# Patient Record
Sex: Male | Born: 1950
Health system: Southern US, Community
[De-identification: ages and names within clinical notes are randomized; demographics above are authoritative.]

## PROBLEM LIST (undated history)

## (undated) DIAGNOSIS — I10 Essential (primary) hypertension: Secondary | ICD-10-CM

## (undated) HISTORY — PX: CHOLECYSTECTOMY: SHX55

## (undated) HISTORY — PX: COLONOSCOPY: SHX174

---

## 2001-10-03 ENCOUNTER — Ambulatory Visit (HOSPITAL_COMMUNITY): Admission: RE | Admit: 2001-10-03 | Discharge: 2001-10-03 | Payer: Self-pay | Admitting: Internal Medicine

## 2002-07-23 ENCOUNTER — Ambulatory Visit (HOSPITAL_COMMUNITY): Admission: RE | Admit: 2002-07-23 | Discharge: 2002-07-23 | Payer: Self-pay | Admitting: *Deleted

## 2002-08-10 ENCOUNTER — Ambulatory Visit (HOSPITAL_COMMUNITY): Admission: RE | Admit: 2002-08-10 | Discharge: 2002-08-10 | Payer: Self-pay | Admitting: *Deleted

## 2002-08-11 ENCOUNTER — Ambulatory Visit (HOSPITAL_COMMUNITY): Admission: RE | Admit: 2002-08-11 | Discharge: 2002-08-11 | Payer: Self-pay | Admitting: *Deleted

## 2003-03-30 ENCOUNTER — Ambulatory Visit (HOSPITAL_COMMUNITY): Admission: RE | Admit: 2003-03-30 | Discharge: 2003-03-30 | Payer: Self-pay | Admitting: *Deleted

## 2004-01-05 ENCOUNTER — Other Ambulatory Visit: Admission: RE | Admit: 2004-01-05 | Discharge: 2004-01-05 | Payer: Self-pay | Admitting: Dermatology

## 2007-01-23 ENCOUNTER — Ambulatory Visit: Payer: Self-pay | Admitting: Internal Medicine

## 2007-02-24 ENCOUNTER — Ambulatory Visit (HOSPITAL_COMMUNITY): Admission: RE | Admit: 2007-02-24 | Discharge: 2007-02-24 | Payer: Self-pay | Admitting: Internal Medicine

## 2007-02-24 ENCOUNTER — Ambulatory Visit: Payer: Self-pay | Admitting: Internal Medicine

## 2010-08-22 NOTE — Op Note (Signed)
Marcus [name], Marcus [name]              ACCOUNT NO.:  [id number]   MEDICAL RECORD NO.:  [id number]          PATIENT TYPE:  AMB   LOCATION:  DAY                           FACILITY:  [facility]   PHYSICIAN:  R. Roetta Sessions, M.D. DATE OF BIRTH:  08-08-1950   DATE OF PROCEDURE:  02/24/2007  DATE OF DISCHARGE:                               OPERATIVE REPORT   DIAGNOSTIC PROCEDURE:  Colonoscopy.   ENDOSCOPIST:  Dr. Jonathon Bellows.   INDICATIONS FOR PROCEDURE:  A 60 year old gentleman with a history of  intermittent pruritus ani, a history of internal hemorrhoids.  He has a  family history of colorectal cancer.  This approach was discussed with  the patient at length.  The potential risks, benefits and alternatives  have been reviewed and questions have been answered and he is agreeable.  It is notable that Analpram-HC cream was prescribed in the office on  January 23, 2007.  This has helped significantly with his pruritus  symptoms.  He has not passed any blood per rectum recently.   DESCRIPTION OF PROCEDURE:  O2 saturation, blood pressure, pulse and  respirations were monitored throughout the entire procedure.  Conscious  sedation with Versed 5 mg IV and Dermol 75 mg IV in divided doses.  The  instrument was Pentax video-chip system.   FINDINGS:  A digital rectal exam revealed no abnormalities.  The  endoscopic prep was sub-optimal with granular residual stool lining of  the entire colon, which made it more difficult to see the mucosa.   COLON:  The colonic mucosa was surveyed from the rectosigmoid junction  through the left and transverse and right colon to the appendicular  orifice, the ileocecal valve and cecum.  These structures were well-seen  and photographed for the record.  The terminal ileum was intubated to 5  cm.  From this point the scope was slowly and cautiously withdrawn.  All  previously-mentioned mucosal surfaces were again seen.  The colonic  mucosa appeared normal, as did  the terminal ileum mucosa.  The scope was  put in the rectum where __________  the rectal mucosa, including  retroflexion in the anal verge and a view of the anal canal did  demonstrate minimal friable anal canal tissue.   The patient tolerated the procedure well.  He was reactive in endoscopy.   IMPRESSION:  1. Friable anal canal, otherwise normal rectum and terminal ileum.  2. An occult anal fissure is not excluded.   RECOMMENDATIONS:  [name] is constipated.  He has only two to three  bowel movements weekly and does strain when he has a bowel movement.  In  this setting, I feel that a substantial daily fiber supplement taken  religiously every day will markedly and dramatically improve his  anorectal symptoms.  Consequently I have  recommended that he start a psyllium supplement in the way of Metamucil  power or caplets or wafers daily without fail, with good 8 to 16 ounces  of liquids.  If this does not markedly improve his anorectal symptoms,  he is to let us know.  I recommend that he  have a repeat colonoscopy in  five years.      Jonathon Bellows, M.D.  Electronically Signed     RMR/MEDQ  D:  02/24/2007  T:  02/24/2007  Job:  [id number]   cc:   Patrica Duel, M.D.  Fax: [phone]

## 2010-08-22 NOTE — Consult Note (Signed)
Marcus [name], Marcus [name]              ACCOUNT NO.:  [id number]   MEDICAL RECORD NO.:  [id number]          PATIENT TYPE:  AMB   LOCATION:  DAY                           FACILITY:  [facility]   PHYSICIAN:  R. Roetta Sessions, M.D. DATE OF BIRTH:  01-Nov-1950   DATE OF CONSULTATION:  01/23/2007  DATE OF DISCHARGE:                                 CONSULTATION   REASON FOR CONSULTATION:  Pruritus ani.   PHYSICIAN REQUESTING CONSULTATION:  Patrica Duel, M.D.   HISTORY OF PRESENT ILLNESS:  The patient is a 60 year old gentleman who  presents today for further evaluation of anorectal burning and itching.  He states he has had these symptoms off and on for several months.  After he has a bowel movement, he has severe burning and itching which  drives me crazy.  He denies any hard stools.  Generally, his stools  are very soft and occur every other day.  He rarely has to do any  straining.  Denies any blood in the stool.  He is currently using  Vaseline and Preparation H wipes.  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  He has an intentional weight loss  of 8 pounds.  He denies any abdominal pain, nausea, vomiting, or  heartburn.  He takes OTC acid reducer as needed for heartburn with good  results.   CURRENT MEDICATIONS:  1. OTC acid reducer daily as needed.  2. Vaseline p.r.n.  3. Preparation H wipes p.r.n.   ALLERGIES:  NO KNOWN DRUG ALLERGIES.   PAST MEDICAL HISTORY:  He had a cardiac catheterization which was  negative in 2003 according to the patient. He has also had chronic GERD  and a cholecystectomy.  Patient had an EGD June of 2003 which revealed a  patulous EG junction, a solitary prepyloric antral erosion, otherwise  negative.  H. pylori serologies were positive and he underwent Prevpac  therapy.  Colonoscopy in 2000 revealed internal hemorrhoids.   FAMILY HISTORY:  Father deceased, age 56, of CHF.  He had a paternal  grandfather who had colorectal  cancer.   SOCIAL HISTORY:  He is married with 2 children.  He is employed with  [employer].  Never been a smoker.  No alcohol use.   REVIEW OF SYSTEMS:  See HPI for GI.  CONSTITUTIONAL:  See HPI.  CARDIOPULMONARY:  No chest pain, shortness of breath, palpitations, or  cough.   PHYSICAL EXAM:  Weight 222.  Height 6 feet 3 inches.  Temp 98.1.  Blood  pressure 118/78.  Pulse 64.  GENERAL:  Pleasant, well-nourished, well-developed, Caucasian male in no  acute distress.  SKIN:  Warm and dry.  No jaundice.  HEENT:  Sclerae nonicteric.  Oropharyngeal mucosa moist and pink.  No  lesions, erythema, or exudate.  No lymphadenopathy or thyromegaly.  CHEST:  Lungs are clear to auscultation.  CARDIAC:  Reveals regular rate and rhythm.  Normal S1 and S2.  No  murmurs, rubs, or gallops.  ABDOMEN:  Positive bowel sounds.  Abdomen soft, nontender, and  nondistended.  No organomegaly or masses.  No rebound tenderness or  guarding.  No abdominal  bruits or hernias.  EXTREMITIES:  No edema.  RECTAL:  Deferred to time of colonoscopy.   IMPRESSION:  The patient is a 60 year old gentleman with several-month  history of pruritus ani.  History of internal hemorrhoids.  Suspect his  symptoms secondary to benign anorectal disease.  He is due for followup  colonoscopy as recommended previously by Dr. Jena Gauss.  He is supposed to  have one sometime this year.  Because of ongoing symptoms and family  history of colon and rectal cancer, we will go ahead and proceed with  colonoscopy at this time.  Please note patient requested that I defer  rectal exam until time of colonoscopy.   PLAN:  1. Colonoscopy with Dr. Jena Gauss in the near future.  2. Analpram-HCapply anorectally t.i.d., samples provided.  Further      recommendations will follow.      Tana Coast, P.AJonathon Bellows, M.D.  Electronically Signed    LL/MEDQ  D:  01/23/2007  T:  01/24/2007  Job:  [id number]   cc:   Patrica Duel, M.D.   Fax: [phone]

## 2010-08-25 NOTE — Procedures (Signed)
   NAME:  Marcus [name], Marcus [name]                        ACCOUNT NO.:  [id number]   MEDICAL RECORD NO.:  [id number]                   PATIENT TYPE:  OUT   LOCATION:  RAD                                  FACILITY:  [facility]   PHYSICIAN:  Vida Roller, M.D.                DATE OF BIRTH:  12/16/1950   DATE OF PROCEDURE:  DATE OF DISCHARGE:                                  ECHOCARDIOGRAM   REFERRING PHYSICIAN:  Dr. Patrica Duel.   Pit #[id number]  Tip Count: [id number]   This is a 60 year old gentleman with palpitations.  This is an assessment  for LV function.   The technical quality of the study was suboptimal but likely diagnostic.  M-  mode measurements:  The aorta is 33 mm.   The left atrium is 46 mm which is enlarged.   The septum is 12 mm which is enlarged.   The posterior wall is 11 mm.   The left ventricular diastolic dimension is 41 mm.   The left ventricular systolic dimension is 34 mm.   2-D and Doppler imaging:  The left ventricle is normal size.  The overall  left ventricular function is moderately depressed with an estimated ejection  fraction of 30 to 35%.  There is evidence of apical hypokinesis that extends  from the distal anterior wall around to the distal inferior wall, and there  is also evidence of mild distal lateral wall hypokinesis as well.  The  diastolic function was not assessed.   The right ventricle appears to be normal size with normal systolic function.   Both atria are mildly enlarged.  There is no evidence of atrial septal  defect.   The aortic valve appears to be tri-leaflet.  It is mildly calcified.  Leaflet movement appears normal.  There is no stenosis or regurgitation  seen.   The mitral valve has mild annular calcification with trace to mild  insufficiency, no stenosis is seen.   The tricuspid valve appears morphologically unremarkable with mild  regurgitation, no stenosis is seen.   The pulmonic valve was not well seen.   The  aorta is normal size but is not well imaged in multiple views.   The tri-cardial structures appear normal.   The inferior vena cava appears to be normal size.                                                 Vida Roller, M.D.    JH/MEDQ  D:  07/24/2002  T:  07/24/2002  Job:  [id number]

## 2010-08-25 NOTE — Procedures (Signed)
NAME:  Marcus [name], Marcus [name]                        ACCOUNT NO.:  [id number]   MEDICAL RECORD NO.:  [id number]                   PATIENT TYPE:  OUT   LOCATION:  RAD                                  FACILITY:  [facility]   PHYSICIAN:  Willa Rough, M.D.                  DATE OF BIRTH:  13-Nov-1950   DATE OF PROCEDURE:  03/30/2003  DATE OF DISCHARGE:                                  ECHOCARDIOGRAM   INDICATIONS FOR PROCEDURE:  The patient has had chest pain and this study is  done for further evaluation. There is a report of an echocardiogram done in  April of 2004.   2-D ECHOCARDIOGRAM:  Aorta 32 mm.   Aortic valve is normal.   Left atrium 46 mm.   Mitral valve:  There is flat closure of mitral valve but no definite mitral  valve prolapse. There may be slight redundancy of the anterior leaflet.   Left ventricle:  End diastolic dimension 39 mm and systolic dimension 33 mm.  Wall thickness is 11 mm. Wall motion analysis reveals that at this time, the  ejection fraction is in the 60% range. From the report of April of 2004, the  ejection fraction at that time was in the 35% range and therefore, the  overall function appears to be significantly improved. On the prior study,  there was mention of decreased motion of the apex. The apex appears to move  well at this time. There is question of some hypokinesis and possibly some  dyskinesis of the inferior wall at the base. The short axis views may be off  axis and this may affect the reading in this area.   Right ventricle normal.   Tricuspid valve normal.   Pulmonic valve normal.   Pericardial effusion:  There is no significant pericardial effusion.   Doppler analysis reveals mild tricuspid regurgitation. Right heart pressures  are not estimated from this study.   IMPRESSION:  This study shows that there are no major valvular problems. The  overall left ventricular function is good and seems improved since the  report of the echocardiogram  of April of 2004. Currently there is question  of some decreased motion of the inferior wall at the base.      ___________________________________________                                            Willa Rough, M.D.   JK/MEDQ  D:  03/30/2003  T:  03/30/2003  Job:  [id number]   cc:   Patrica Duel, M.D.  [address], [address]  [place]  [place] [address]  Fax: [phone]   Vida Roller, M.D.  Fax: [phone]

## 2010-08-25 NOTE — Cardiovascular Report (Signed)
NAME:  Marcus [name], Marcus [name]                        ACCOUNT NO.:  [id number]   MEDICAL RECORD NO.:  [id number]                   PATIENT TYPE:  OIB   LOCATION:  2899                                 FACILITY:  [facility]   PHYSICIAN:  Vida Roller, M.D.                DATE OF BIRTH:  01-30-1951   DATE OF PROCEDURE:  08/11/2002  DATE OF DISCHARGE:                              CARDIAC CATHETERIZATION   PROCEDURES:  Left heart catheterization, selective coronary angiography and  left ventriculography all via the right radial approach.   HISTORY OF PRESENT ILLNESS:  The patient is a 60 year old gentleman with  risk factors of hypertension, hyperlipidemia in family history who presented  to see me with atypical chest discomfort.  He underwent a treadmill exam  where he was able to exercise nine minutes of a Bruce protocol, achieved 10  minutes of exercise and had no ischemic ST and T wave changes, but had  nonsustained ventricular tachycardia which was asymptomatic on EKG.  An  echocardiogram performed after the stress test showed depressed LV function  with ejection fraction estimated at 30% and he was referred for elective  heart catheterization.  Since that time, he has been started on a beta-  blocker and has had resolution of his symptoms feeling well without any  difficulties.  He comes to the catheterization laboratory for an elective  case.   DESCRIPTION OF PROCEDURE:  After obtaining informed consent and a detailed  evaluation including a balance test of the right wrist, the patient was  brought to the cardiac catheterization laboratory in fasting state.  There  he was prepped and draped in the usual sterile manner and local anesthetic  was obtained over the right wrist using 1% lidocaine without epinephrine.  The right radial artery was cannulated using the modified Seldinger  technique with a 6-French, 10 cm sheath and left heart catheterization was  performed using a 6-French,  Judkins left 4, a 6-French, Judkins right 4 and  a 6-French angled pigtail catheter.  The pigtail catheter was advanced under  fluoroscopic guidance into the ascending aorta and then prolapsed across the  aortic valve.  There it was connected to a pressure tracing system and then  to power injection where a power injection was performed at a rate of 13 cc  per second for a total of 39 cc at a 1/2 second rate of right 600 PSI.  At  conclusion of the left ventriculogram, the catheter was reconnected to the  pressure tracing system and prolapsed across the aortic valve and continuous  pressure monitoring.  The catheter was then removed.  The patient had a  RadStat device placed and hemostasis was obtained with that.  The patient  was moved back to the cardiology holding area with no evidence of ecchymosis  or hematoma formation.  Total fluoroscopic time was 9.3 minutes.  Total  contrast was 140 cc.  RESULTS:  Left ventricular pressure was measured at 119/70 with an end-  diastolic pressure of 13 mmHg.   Aortic pressure was measured at 118/72 with a mean arterial pressure of 93  mmHg.   Selected coronary angiography:   The left main coronary artery is a large artery which is angiographically  normal.   The left circumflex coronary artery is a large dominant vessel with two  moderate sized obtuse marginals, a moderate size posterolateral branch and a  normal size posterior descending coronary artery.   The left anterior descending coronary artery is a normal sized artery which  has two small diagonal branches and is otherwise a normal appearing artery.   The right coronary artery is a small, nondominant vessel.   The entire coronary distribution is angiographically free of discharge.   Left ventriculogram:   The left ventriculogram reveals an ejection fraction of approximately 50-55%  with no mitral regurgitation and no significant wall motion abnormalities.   ASSESSMENT:  This  is a gentleman who has a mild, nonischemic cardiomyopathy  which probably was secondary to a viral illness which has resolved over the  course of his therapy.  We will continue to keep him on low dose of beta-  blocker both for his hypertension and fr the nonsustained ventricular  tachycardia and will follow his risks factors including his  hypercholesterolemia.                                               Vida Roller, M.D.    JH/MEDQ  D:  08/11/2002  T:  08/12/2002  Job:  [id number]

## 2010-08-25 NOTE — Op Note (Signed)
[facility]  Patient:    Marcus [name], Marcus [name] Visit Number: [id number] MRN: [id number]          Service Type: END Location: DAY Attending Physician:  Jonathon Bellows Dictated by:   Roetta Sessions, M.D. Proc. Date: 10/03/01 Admit Date:  10/03/2001 Discharge Date: 10/03/2001   CC:         Patrica Duel, M.D.   Operative Report  PROCEDURE:  Esophagogastroduodenoscopy.  ENDOSCOPIST:  Roetta Sessions, M.D.  INDICATIONS:  This is a 60 year old gentleman with a 10 year history of gastroesophageal reflux disease without arm symptoms.  He takes Prevacid 30 mg orally daily on a p.r.n. basis.  Esophagogastroduodenoscopy is now being done to further evaluate his long-standing GERD.  This approach has been discussed with the patient at length at the bedside.  Potential risks, benefits and alternatives have been reviewed and any questions answered.  He is agreeable. Please see my dictated H&P for information.  He is agreeable for conscious sedation.  DESCRIPTION OF PROCEDURE:  The patient was placed in the left lateral decubitus position.  O2 saturation, blood pressure, pulse and respirations were monitored throughout the entire procedure with conscious sedation with Versed 3 mg IV and Demerol 75 mg IV.  Cetacaine spray for topical pharyngeal anesthesia.  Instrument was an Olympus video chip gastroscope.  Findings:  Examination of the tubular esophagus revealed normal mucosa.  The EG junction was patulous and easily traversed.  Stomach:  The gastric cavity insufflated well with air.  Thorough examination of the gastric mucosa including retroflexed view of the proximal stomach, esophagogastric junction, demonstrated a solitary antral erosion approximately 3 mm in diameter.  Otherwise, the mucosa appeared normal, and the pylorus was patent and easily traversed.  Duodenum:  The bulb and second portion appeared normal.  Thereapeutic/diagnostic maneuvers:   None.  The patient tolerated the procedure well and was reactive in endoscopy.  IMPRESSION: 1. Patulous esophagogastric junction but esophageal mucosa appeared normal. 2. Solitary prepyloric antral erosion.  The remainder of the gastric mucosa    appeared normal. 3. Normal first and second duodenum.  RECOMMENDATIONS: 1. Take Prevacid 30 mg orally every day before breakfast. 2. Antireflux measures, literature provided to the patient. 3. Helicobacter pylori serologies. 4. Further recommendations to follow. Dictated by:   Roetta Sessions, M.D. Attending Physician:  Jonathon Bellows DD:  10/03/01 TD:  10/05/01 Job: [id number]

## 2012-01-17 ENCOUNTER — Encounter: Payer: Self-pay | Admitting: Internal Medicine

## 2015-05-19 ENCOUNTER — Ambulatory Visit (INDEPENDENT_AMBULATORY_CARE_PROVIDER_SITE_OTHER): Payer: 59 | Admitting: Otolaryngology

## 2015-05-19 DIAGNOSIS — H7201 Central perforation of tympanic membrane, right ear: Secondary | ICD-10-CM | POA: Diagnosis not present

## 2015-05-19 DIAGNOSIS — H6123 Impacted cerumen, bilateral: Secondary | ICD-10-CM

## 2015-05-19 DIAGNOSIS — H9071 Mixed conductive and sensorineural hearing loss, unilateral, right ear, with unrestricted hearing on the contralateral side: Secondary | ICD-10-CM | POA: Diagnosis not present

## 2015-05-19 DIAGNOSIS — H903 Sensorineural hearing loss, bilateral: Secondary | ICD-10-CM

## 2015-11-17 ENCOUNTER — Ambulatory Visit (INDEPENDENT_AMBULATORY_CARE_PROVIDER_SITE_OTHER): Payer: 59 | Admitting: Otolaryngology

## 2015-11-17 DIAGNOSIS — H6523 Chronic serous otitis media, bilateral: Secondary | ICD-10-CM | POA: Diagnosis not present

## 2015-11-17 DIAGNOSIS — H6983 Other specified disorders of Eustachian tube, bilateral: Secondary | ICD-10-CM

## 2015-11-17 DIAGNOSIS — H6123 Impacted cerumen, bilateral: Secondary | ICD-10-CM

## 2015-11-17 DIAGNOSIS — H900 Conductive hearing loss, bilateral: Secondary | ICD-10-CM

## 2016-04-05 ENCOUNTER — Other Ambulatory Visit (HOSPITAL_COMMUNITY): Payer: Self-pay | Admitting: Family Medicine

## 2016-04-05 DIAGNOSIS — E041 Nontoxic single thyroid nodule: Secondary | ICD-10-CM

## 2016-05-03 ENCOUNTER — Ambulatory Visit (HOSPITAL_COMMUNITY)
Admission: RE | Admit: 2016-05-03 | Discharge: 2016-05-03 | Disposition: A | Discharge status: Home / Self Care | Payer: Medicare Other | Source: Ambulatory Visit | Attending: Family Medicine | Admitting: Family Medicine

## 2016-05-03 DIAGNOSIS — E041 Nontoxic single thyroid nodule: Secondary | ICD-10-CM

## 2016-05-14 ENCOUNTER — Ambulatory Visit (INDEPENDENT_AMBULATORY_CARE_PROVIDER_SITE_OTHER): Payer: Medicare Other | Admitting: Otolaryngology

## 2016-05-14 DIAGNOSIS — H7203 Central perforation of tympanic membrane, bilateral: Secondary | ICD-10-CM | POA: Diagnosis not present

## 2016-05-14 DIAGNOSIS — D440 Neoplasm of uncertain behavior of thyroid gland: Secondary | ICD-10-CM

## 2016-05-15 ENCOUNTER — Other Ambulatory Visit (INDEPENDENT_AMBULATORY_CARE_PROVIDER_SITE_OTHER): Payer: Self-pay | Admitting: Otolaryngology

## 2016-05-15 DIAGNOSIS — E041 Nontoxic single thyroid nodule: Secondary | ICD-10-CM

## 2016-05-24 ENCOUNTER — Encounter (HOSPITAL_COMMUNITY): Payer: Self-pay

## 2016-05-24 ENCOUNTER — Ambulatory Visit (HOSPITAL_COMMUNITY)
Admission: RE | Admit: 2016-05-24 | Discharge: 2016-05-24 | Disposition: A | Discharge status: Home / Self Care | Payer: Medicare Other | Source: Ambulatory Visit | Attending: Otolaryngology | Admitting: Otolaryngology

## 2016-05-24 DIAGNOSIS — E041 Nontoxic single thyroid nodule: Secondary | ICD-10-CM | POA: Insufficient documentation

## 2016-05-24 HISTORY — DX: Essential (primary) hypertension: I10

## 2016-05-24 MED ORDER — LIDOCAINE HCL (PF) 2 % IJ SOLN
INTRAMUSCULAR | Status: AC
  Filled 2016-05-24: qty 10

## 2016-05-24 NOTE — Discharge Instructions (Signed)
Thyroid Biopsy °The thyroid gland is a butterfly-shaped gland located in the front of the neck. It produces hormones that affect metabolism, growth and development, and body temperature. Thyroid biopsy is a procedure in which small samples of tissue or fluid are removed from the thyroid gland. The samples are then looked at under a microscope to check for abnormalities. This procedure is done to determine the cause of thyroid problems. It may be done to check for infection, cancer, or other thyroid problems. °Two methods may be used for a thyroid biopsy. In one method, a thin needle is inserted through the skin and into the thyroid gland. In the other method, an open incision is made through the skin. °Tell a health care provider about: °· Any allergies you have. °· All medicines you are taking, including vitamins, herbs, eye drops, creams, and over-the-counter medicines. °· Any problems you or family members have had with anesthetic medicines. °· Any blood disorders you have. °· Any surgeries you have had. °· Any medical conditions you have. °What are the risks? °Generally, this is a safe procedure. However, problems can occur and include: °· Bleeding from the procedure site. °· Infection. °· Injury to structures near the thyroid gland. °What happens before the procedure? °· Ask your health care provider about: °¨ Changing or stopping your regular medicines. This is especially important if you are taking diabetes medicines or blood thinners. °¨ Taking medicines such as aspirin and ibuprofen. These medicines can thin your blood. Do not take these medicines before your procedure if your health care provider asks you not to. °· Do not eat or drink anything after midnight on the night before the procedure or as directed by your health care provider. °· You may have a blood sample taken. °What happens during the procedure? °Either of these methods may be used to perform a thyroid biopsy: °· Fine needle biopsy. You may  be given medicine to help you relax (sedative). You will be asked to lie on your back with your head tipped backward to extend your neck. An area on your neck will be cleaned. A needle will then be inserted through the skin of your neck. You may be asked to avoid coughing, talking, swallowing, or making sounds during some portions of the procedure. The needle will be withdrawn once the tissue or fluid samples have been removed. Pressure may be applied to your neck to reduce swelling and ensure that bleeding has stopped. The samples will be sent to a lab for examination. °· Open biopsy. You will be given medicine to make you sleep (general anesthetic). An incision will be made in your neck. A sample of thyroid tissue will be removed using surgical tools. The tissue sample will be sent for examination. In some cases, the sample may be examined during the biopsy. If that is done and cancer cells are found, some or all of the thyroid gland may be removed. The incision will be closed with stitches. °What happens after the procedure? °· Your recovery will be assessed and monitored. °· You may have soreness and tenderness at the site of the biopsy. This should go away after a few days. °· If you had an open biopsy, you may have a hoarse voice or sore throat for a couple days. °· It is your responsibility to get your test results. °This information is not intended to replace advice given to you by your health care provider. Make sure you discuss any questions you have with your health   care provider. °Document Released: 01/21/2007 Document Revised: 11/27/2015 Document Reviewed: 06/18/2013 °Elsevier Interactive Patient Education © 2017 [employer] ° °

## 2016-05-24 NOTE — Procedures (Signed)
PreOperative Dx: RT thyroid nodule Postoperative Dx: RT thyroid nodule Procedure:   US guided FNA of RT thyroid nodule Radiologist:  Thornton Papas Anesthesia:  3 ml of 2% lidocaine Specimen:  FNA x 3  EBL:   < 1 ml Complications: None

## 2016-05-28 DIAGNOSIS — H1132 Conjunctival hemorrhage, left eye: Secondary | ICD-10-CM | POA: Diagnosis not present

## 2016-05-28 DIAGNOSIS — S0502XA Injury of conjunctiva and corneal abrasion without foreign body, left eye, initial encounter: Secondary | ICD-10-CM | POA: Diagnosis not present

## 2016-08-21 DIAGNOSIS — H1132 Conjunctival hemorrhage, left eye: Secondary | ICD-10-CM | POA: Diagnosis not present

## 2016-08-29 DIAGNOSIS — H2513 Age-related nuclear cataract, bilateral: Secondary | ICD-10-CM | POA: Diagnosis not present

## 2016-11-08 DIAGNOSIS — L218 Other seborrheic dermatitis: Secondary | ICD-10-CM | POA: Diagnosis not present

## 2016-11-08 DIAGNOSIS — D224 Melanocytic nevi of scalp and neck: Secondary | ICD-10-CM | POA: Diagnosis not present

## 2016-11-08 DIAGNOSIS — B078 Other viral warts: Secondary | ICD-10-CM | POA: Diagnosis not present

## 2016-11-26 ENCOUNTER — Ambulatory Visit (INDEPENDENT_AMBULATORY_CARE_PROVIDER_SITE_OTHER): Payer: Medicare Other | Admitting: Otolaryngology

## 2016-11-26 DIAGNOSIS — H7203 Central perforation of tympanic membrane, bilateral: Secondary | ICD-10-CM | POA: Diagnosis not present

## 2016-11-26 DIAGNOSIS — H6121 Impacted cerumen, right ear: Secondary | ICD-10-CM | POA: Diagnosis not present

## 2016-11-26 DIAGNOSIS — D440 Neoplasm of uncertain behavior of thyroid gland: Secondary | ICD-10-CM

## 2017-01-15 DIAGNOSIS — I10 Essential (primary) hypertension: Secondary | ICD-10-CM | POA: Diagnosis not present

## 2017-01-15 DIAGNOSIS — Z1329 Encounter for screening for other suspected endocrine disorder: Secondary | ICD-10-CM | POA: Diagnosis not present

## 2017-01-15 DIAGNOSIS — Z0000 Encounter for general adult medical examination without abnormal findings: Secondary | ICD-10-CM | POA: Diagnosis not present

## 2017-01-15 DIAGNOSIS — Z125 Encounter for screening for malignant neoplasm of prostate: Secondary | ICD-10-CM | POA: Diagnosis not present

## 2017-01-15 DIAGNOSIS — Z1389 Encounter for screening for other disorder: Secondary | ICD-10-CM | POA: Diagnosis not present

## 2017-01-15 DIAGNOSIS — Z0001 Encounter for general adult medical examination with abnormal findings: Secondary | ICD-10-CM | POA: Diagnosis not present

## 2017-01-15 DIAGNOSIS — Z23 Encounter for immunization: Secondary | ICD-10-CM | POA: Diagnosis not present

## 2017-01-15 DIAGNOSIS — N401 Enlarged prostate with lower urinary tract symptoms: Secondary | ICD-10-CM | POA: Diagnosis not present

## 2017-01-29 DIAGNOSIS — R319 Hematuria, unspecified: Secondary | ICD-10-CM | POA: Diagnosis not present

## 2017-03-08 DIAGNOSIS — N183 Chronic kidney disease, stage 3 (moderate): Secondary | ICD-10-CM | POA: Diagnosis not present

## 2017-03-08 DIAGNOSIS — N400 Enlarged prostate without lower urinary tract symptoms: Secondary | ICD-10-CM | POA: Diagnosis not present

## 2017-03-08 DIAGNOSIS — K219 Gastro-esophageal reflux disease without esophagitis: Secondary | ICD-10-CM | POA: Diagnosis not present

## 2017-03-25 ENCOUNTER — Other Ambulatory Visit (HOSPITAL_COMMUNITY): Payer: Self-pay | Admitting: Nephrology

## 2017-03-25 DIAGNOSIS — N183 Chronic kidney disease, stage 3 unspecified: Secondary | ICD-10-CM

## 2017-04-22 ENCOUNTER — Ambulatory Visit (HOSPITAL_COMMUNITY): Payer: Medicare Other

## 2017-05-27 ENCOUNTER — Ambulatory Visit (INDEPENDENT_AMBULATORY_CARE_PROVIDER_SITE_OTHER): Payer: Medicare Other | Admitting: Otolaryngology

## 2017-05-27 DIAGNOSIS — H7203 Central perforation of tympanic membrane, bilateral: Secondary | ICD-10-CM

## 2017-05-27 DIAGNOSIS — H6983 Other specified disorders of Eustachian tube, bilateral: Secondary | ICD-10-CM

## 2017-07-02 ENCOUNTER — Encounter: Payer: Self-pay | Admitting: Internal Medicine

## 2017-07-25 DIAGNOSIS — H01025 Squamous blepharitis left lower eyelid: Secondary | ICD-10-CM | POA: Diagnosis not present

## 2017-07-29 ENCOUNTER — Ambulatory Visit (INDEPENDENT_AMBULATORY_CARE_PROVIDER_SITE_OTHER): Payer: Self-pay

## 2017-07-29 DIAGNOSIS — Z1211 Encounter for screening for malignant neoplasm of colon: Secondary | ICD-10-CM

## 2017-07-29 MED ORDER — PEG 3350-KCL-NA BICARB-NACL 420 G PO SOLR: 4000.0000 mL | ORAL | 0 refills | Status: DC

## 2017-07-29 NOTE — Patient Instructions (Addendum)
Marcus [name]   Jan 13, 1951 MRN: [id number]    Procedure Date: 08/21/17 Time to register: 12:00pm Place to register: [facility] Procedure Time: 1:00pm Scheduled provider: R. Garfield Cornea, MD  PREPARATION FOR COLONOSCOPY WITH TRI-LYTE SPLIT PREP  Please notify us immediately if you are diabetic, take iron supplements, or if you are on Coumadin or any other blood thinners.     You will need to purchase 1 fleet enema and 1 box of Bisacodyl '5mg'$  tablets.   2 DAYS BEFORE PROCEDURE:  DATE: 08/19/17   DAY: Monday Begin clear liquid diet AFTER your lunch meal. NO SOLID FOODS after this point.  1 DAY BEFORE PROCEDURE:  DATE: 08/20/17   DAY: Tuesday Continue clear liquids the entire day - NO SOLID FOOD.     At 2:00 pm:  Take 2 Bisacodyl tablets.   At 4:00pm:  Start drinking your solution. Make sure you mix well per instructions on the bottle. Try to drink 1 (one) 8 ounce glass every 10-15 minutes until you have consumed HALF the jug. You should complete by 6:00pm.You must keep the left over solution refrigerated until completed next day.  Continue clear liquids. You must drink plenty of clear liquids to prevent dehyration and kidney failure. Nothing to eat or drink after midnight.  EXCEPTION: If you take medications for your heart, blood pressure or breathing, you may take these medications with a small amount of clear liquid.    DAY OF PROCEDURE:   DATE: 08/21/17   DAY: Wednesday    Five hours before your procedure time @ 8:00am:  Finish remaining amout of bowel prep, drinking 1 (one) 8 ounce glass every 10-15 minutes until complete. You have two hours to consume remaining prep.   Three hours before your procedure time '@10'$ :00am:  Nothing by mouth.   At least one hour before going to the hospital:  Give yourself one Fleet enema. You may take your morning medications with sip of water unless we have instructed otherwise.      Please see below for Dietary  Information.  CLEAR LIQUIDS INCLUDE:  Water Jello (NOT red in color)   Ice Popsicles (NOT red in color)   Tea (sugar ok, no milk/cream) Powdered fruit flavored drinks  Coffee (sugar ok, no milk/cream) Gatorade/ Lemonade/ Kool-Aid  (NOT red in color)   Juice: apple, white grape, white cranberry Soft drinks  Clear bullion, consomme, broth (fat free beef/chicken/vegetable)  Carbonated beverages (any kind)  Strained chicken noodle soup Hard Candy   Remember: Clear liquids are liquids that will allow you to see your fingers on the other side of a clear glass. Be sure liquids are NOT red in color, and not cloudy, but CLEAR.  DO NOT EAT OR DRINK ANY OF THE FOLLOWING:  Dairy products of any kind   Cranberry juice Tomato juice / V8 juice   Grapefruit juice Orange juice     Red grape juice  Do not eat any solid foods, including such foods as: cereal, oatmeal, yogurt, fruits, vegetables, creamed soups, eggs, bread, crackers, pureed foods in a blender, etc.   HELPFUL HINTS FOR DRINKING PREP SOLUTION:   Make sure prep is extremely cold. Mix and refrigerate the the morning of the prep. You may also put in the freezer.   You may try mixing some Crystal Light or Country Time Lemonade if you prefer. Mix in small amounts; add more if necessary.  Try drinking through a straw  Rinse mouth with water or a mouthwash  between glasses, to remove after-taste.  Try sipping on a cold beverage /ice/ popsicles between glasses of prep.  Place a piece of sugar-free hard candy in mouth between glasses.  If you become nauseated, try consuming smaller amounts, or stretch out the time between glasses. Stop for 30-60 minutes, then slowly start back drinking.        OTHER INSTRUCTIONS  You will need a responsible adult at least 68 years of age to accompany you and drive you home. This person must remain in the waiting room during your procedure. The hospital will cancel your procedure if you do not have a  responsible adult with you.   1. Wear loose fitting clothing that is easily removed. 2. Leave jewelry and other valuables at home.  3. Remove all body piercing jewelry and leave at home. 4. Total time from sign-in until discharge is approximately 2-3 hours. 5. You should go home directly after your procedure and rest. You can resume normal activities the day after your procedure. 6. The day of your procedure you should not:  Drive  Make legal decisions  Operate machinery  Drink alcohol  Return to work   You may call the office (Dept: [phone]) before 5:00pm, or page the doctor on call [phone]) after 5:00pm, for further instructions, if necessary.   Insurance Information YOU WILL NEED TO CHECK WITH YOUR INSURANCE COMPANY FOR THE BENEFITS OF COVERAGE YOU HAVE FOR THIS PROCEDURE.  UNFORTUNATELY, NOT ALL INSURANCE COMPANIES HAVE BENEFITS TO COVER ALL OR PART OF THESE TYPES OF PROCEDURES.  IT IS YOUR RESPONSIBILITY TO CHECK YOUR BENEFITS, HOWEVER, WE WILL BE GLAD TO ASSIST YOU WITH ANY CODES YOUR INSURANCE COMPANY MAY NEED.    PLEASE NOTE THAT MOST INSURANCE COMPANIES WILL NOT COVER A SCREENING COLONOSCOPY FOR PEOPLE UNDER THE AGE OF 50  IF YOU HAVE BCBS INSURANCE, YOU MAY HAVE BENEFITS FOR A SCREENING COLONOSCOPY BUT IF POLYPS ARE FOUND THE DIAGNOSIS WILL CHANGE AND THEN YOU MAY HAVE A DEDUCTIBLE THAT WILL NEED TO BE MET. SO PLEASE MAKE SURE YOU CHECK YOUR BENEFITS FOR A SCREENING COLONOSCOPY AS WELL AS A DIAGNOSTIC COLONOSCOPY.

## 2017-07-29 NOTE — Progress Notes (Signed)
Appropriate.

## 2017-07-29 NOTE — Progress Notes (Addendum)
Gastroenterology Pre-Procedure Review  Request Date:07/29/17 Requesting Physician: Dr.Fusco (last tcs RMR- 02/14/2007 no polyp-paternal grandfather had CRC)  PATIENT REVIEW QUESTIONS: The patient responded to the following health history questions as indicated:    1. Diabetes Melitis: no 2. Joint replacements in the past 12 months: no 3. Major health problems in the past 3 months: no 4. Has an artificial valve or MVP: no 5. Has a defibrillator: no 6. Has been advised in past to take antibiotics in advance of a procedure teeth cleaning: no 7. Family history of colon cancer: paternal grandfather 14. Alcohol Use: no 9. History of sleep apnea: no  10. History of coronary artery or other vascular stents placed within the last 12 months: no 11. History of any prior anesthesia complications: no    MEDICATIONS & ALLERGIES:    Patient reports the following regarding taking any blood thinners:   Plavix? no Aspirin? no Coumadin? no Brilinta? no Xarelto? no Eliquis? no Pradaxa? no Savaysa? no Effient? no  Patient confirms/reports the following medications:  Current Outpatient Medications  Medication Sig Dispense Refill  . amLODipine (NORVASC) 10 MG tablet Take 10 mg by mouth daily.    Marland Kitchen doxycycline (VIBRA-TABS) 100 MG tablet TAKE 2 TABLETS FIRST DAY THEN TAKE 1 TABLET DAILY FOR 10 DAYS  1  . polyethylene glycol (MIRALAX / GLYCOLAX) packet Take 17 g by mouth daily.     No current facility-administered medications for this visit.     Patient confirms/reports the following allergies:  No Known Allergies  No orders of the defined types were placed in this encounter.   AUTHORIZATION INFORMATION Primary Insurance: [place],  [place] #: [id number] Pre-Cert / Josem Kaufmann required: no   SCHEDULE INFORMATION: Procedure has been scheduled as follows:  Date: 08/21/17, Time: 1:00 Location: [facility] Dr.Rourk  This Gastroenterology Pre-Precedure Review Form is being routed to the following provider(s):  Roseanne Kaufman NP

## 2017-07-31 NOTE — Progress Notes (Signed)
Pt asked to be moved up if anyone should cancel. We had a pt to cancel their procedure in May. I called the pt and he accepted that appt. I called Hoyle Sauer and asked her to move pt. I changed pts instructions and have mailed them to him.

## 2017-08-21 ENCOUNTER — Other Ambulatory Visit: Payer: Self-pay

## 2017-08-21 ENCOUNTER — Encounter (HOSPITAL_COMMUNITY): Payer: Self-pay | Admitting: *Deleted

## 2017-08-21 ENCOUNTER — Ambulatory Visit (HOSPITAL_COMMUNITY)
Admission: RE | Admit: 2017-08-21 | Discharge: 2017-08-21 | Disposition: A | Discharge status: Home / Self Care | Payer: Medicare Other | Source: Ambulatory Visit | Attending: Internal Medicine | Admitting: Internal Medicine

## 2017-08-21 ENCOUNTER — Encounter (HOSPITAL_COMMUNITY)
Admission: RE | Disposition: A | Discharge status: Home / Self Care | Payer: Self-pay | Source: Ambulatory Visit | Attending: Internal Medicine

## 2017-08-21 DIAGNOSIS — Z1211 Encounter for screening for malignant neoplasm of colon: Secondary | ICD-10-CM

## 2017-08-21 DIAGNOSIS — Z1212 Encounter for screening for malignant neoplasm of rectum: Secondary | ICD-10-CM

## 2017-08-21 DIAGNOSIS — I10 Essential (primary) hypertension: Secondary | ICD-10-CM | POA: Diagnosis not present

## 2017-08-21 DIAGNOSIS — Z79899 Other long term (current) drug therapy: Secondary | ICD-10-CM | POA: Diagnosis not present

## 2017-08-21 HISTORY — PX: COLONOSCOPY: SHX5424

## 2017-08-21 SURGERY — COLONOSCOPY
Anesthesia: Moderate Sedation

## 2017-08-21 MED ORDER — MEPERIDINE HCL 100 MG/ML IJ SOLN
INTRAMUSCULAR | Status: DC | PRN
  Administered 2017-08-21: 25 mg
  Administered 2017-08-21: 50 mg

## 2017-08-21 MED ORDER — ONDANSETRON HCL 4 MG/2ML IJ SOLN
INTRAMUSCULAR | Status: AC
  Filled 2017-08-21: qty 2

## 2017-08-21 MED ORDER — ONDANSETRON HCL 4 MG/2ML IJ SOLN
INTRAMUSCULAR | Status: DC | PRN
  Administered 2017-08-21: 4 mg via INTRAVENOUS

## 2017-08-21 MED ORDER — MIDAZOLAM HCL 5 MG/5ML IJ SOLN
INTRAMUSCULAR | Status: AC
  Filled 2017-08-21: qty 10

## 2017-08-21 MED ORDER — SODIUM CHLORIDE 0.9 % IV SOLN
INTRAVENOUS | Status: DC
  Administered 2017-08-21: 1000 mL via INTRAVENOUS

## 2017-08-21 MED ORDER — MIDAZOLAM HCL 5 MG/5ML IJ SOLN
INTRAMUSCULAR | Status: DC | PRN
  Administered 2017-08-21: 2 mg via INTRAVENOUS
  Administered 2017-08-21: 1 mg via INTRAVENOUS
  Administered 2017-08-21: 2 mg via INTRAVENOUS

## 2017-08-21 MED ORDER — MEPERIDINE HCL 100 MG/ML IJ SOLN
INTRAMUSCULAR | Status: AC
  Filled 2017-08-21: qty 2

## 2017-08-21 NOTE — Discharge Instructions (Addendum)
°  Colonoscopy Discharge Instructions  Read the instructions outlined below and refer to this sheet in the next few weeks. These discharge instructions provide you with general information on caring for yourself after you leave the hospital. Your doctor may also give you specific instructions. While your treatment has been planned according to the most current medical practices available, unavoidable complications occasionally occur. If you have any problems or questions after discharge, call Dr. Gala Romney at [phone]. ACTIVITY  You may resume your regular activity, but move at a slower pace for the next 24 hours.   Take frequent rest periods for the next 24 hours.   Walking will help get rid of the air and reduce the bloated feeling in your belly (abdomen).   No driving for 24 hours (because of the medicine (anesthesia) used during the test).    Do not sign any important legal documents or operate any machinery for 24 hours (because of the anesthesia used during the test).  NUTRITION  Drink plenty of fluids.   You may resume your normal diet as instructed by your doctor.   Begin with a light meal and progress to your normal diet. Heavy or fried foods are harder to digest and may make you feel sick to your stomach (nauseated).   Avoid alcoholic beverages for 24 hours or as instructed.  MEDICATIONS  You may resume your normal medications unless your doctor tells you otherwise.  WHAT YOU CAN EXPECT TODAY  Some feelings of bloating in the abdomen.   Passage of more gas than usual.   Spotting of blood in your stool or on the toilet paper.  IF YOU HAD POLYPS REMOVED DURING THE COLONOSCOPY:  No aspirin products for 7 days or as instructed.   No alcohol for 7 days or as instructed.   Eat a soft diet for the next 24 hours.  FINDING OUT THE RESULTS OF YOUR TEST Not all test results are available during your visit. If your test results are not back during the visit, make an appointment  with your caregiver to find out the results. Do not assume everything is normal if you have not heard from your caregiver or the medical facility. It is important for you to follow up on all of your test results.  SEEK IMMEDIATE MEDICAL ATTENTION IF:  You have more than a spotting of blood in your stool.   Your belly is swollen (abdominal distention).   You are nauseated or vomiting.   You have a temperature over 101.   You have abdominal pain or discomfort that is severe or gets worse throughout the day.   Consider 1 more screening colonoscopy in 10 years - only of overall health permits.

## 2017-08-21 NOTE — Op Note (Signed)
[facility] Patient Name: Marcus [name] Procedure Date: 08/21/2017 12:33 PM MRN: [id number] Date of Birth: 08/28/1950 Attending MD: Norvel Richards , MD CSN: [id number] Age: 67 Admit Type: Outpatient Procedure:                Colonoscopy Indications:              Screening for colorectal malignant neoplasm Providers:                Norvel Richards, MD, Janeece Riggers, RN, Randa Spike, Technician Referring MD:              Medicines:                Midazolam 5 mg IV, Meperidine 75 mg IV Complications:            No immediate complications. Estimated Blood Loss:     Estimated blood loss: none. Procedure:                Pre-Anesthesia Assessment:                           - Prior to the procedure, a History and Physical                            was performed, and patient medications and                            allergies were reviewed. The patient's tolerance of                            previous anesthesia was also reviewed. The risks                            and benefits of the procedure and the sedation                            options and risks were discussed with the patient.                            All questions were answered, and informed consent                            was obtained. Prior Anticoagulants: The patient has                            taken no previous anticoagulant or antiplatelet                            agents. ASA Grade Assessment: II - A patient with                            mild systemic disease. After reviewing the risks  and benefits, the patient was deemed in                            satisfactory condition to undergo the procedure.                           After obtaining informed consent, the colonoscope                            was passed under direct vision. Throughout the                            procedure, the patient's blood pressure, pulse, and                oxygen saturations were monitored continuously. The                            EC-[id number] ([id number]) scope was introduced through                            the anus and advanced to the the cecum, identified                            by appendiceal orifice and ileocecal valve. The                            colonoscopy was performed without difficulty. The                            patient tolerated the procedure well. The quality                            of the bowel preparation was adequate. The                            ileocecal valve, appendiceal orifice, and rectum                            were photographed. The quality of the bowel                            preparation was adequate. The colonoscopy was                            performed without difficulty. The patient tolerated                            the procedure well. The quality of the bowel                            preparation was adequate. Scope In: 12:46:50 PM Scope Out: 12:59:11 PM Scope Withdrawal Time: 0 hours 8 minutes 20 seconds  Total Procedure Duration: 0 hours 12 minutes 21 seconds  Findings:      The perianal and  digital rectal examinations were normal.      The colon (entire examined portion) appeared normal.      The retroflexed view of the distal rectum and anal verge was normal and       showed no anal or rectal abnormalities. Impression:               - The entire examined colon is normal.                           - The distal rectum and anal verge are normal on                            retroflexion view.                           - No specimens collected. Moderate Sedation:      Moderate (conscious) sedation was administered by the endoscopy nurse       and supervised by the endoscopist. The following parameters were       monitored: oxygen saturation, heart rate, blood pressure, respiratory       rate, EKG, adequacy of pulmonary ventilation, and response to care.       Total  physician intraservice time was 24 minutes. Recommendation:           - Patient has a contact number available for                            emergencies. The signs and symptoms of potential                            delayed complications were discussed with the                            patient. Return to normal activities tomorrow.                            Written discharge instructions were provided to the                            patient.                           - Resume previous diet.                           - Continue present medications.                           - Repeat colonoscopy in 10 years for screening                            purposes only If overall health permits.                           - Return to GI office PRN. Procedure Code(s):        --- Professional ---  45378, Colonoscopy, flexible; diagnostic, including                            collection of specimen(s) by brushing or washing,                            when performed (separate procedure)                           G0500, Moderate sedation services provided by the                            same physician or other qualified health care                            professional performing a gastrointestinal                            endoscopic service that sedation supports,                            requiring the presence of an independent trained                            observer to assist in the monitoring of the                            patient's level of consciousness and physiological                            status; initial 15 minutes of intra-service time;                            patient age 45 years or older (additional time may                            be reported with [phone], as appropriate)                           [phone], Moderate sedation services provided by the                            same physician or other qualified health care                             professional performing the diagnostic or                            therapeutic service that the sedation supports,                            requiring the presence of an independent trained                            observer  to assist in the monitoring of the                            patient's level of consciousness and physiological                            status; each additional 15 minutes intraservice                            time (List separately in addition to code for                            primary service) Diagnosis Code(s):        --- Professional ---                           Z12.11, Encounter for screening for malignant                            neoplasm of colon CPT copyright 2017 American Medical Association. All rights reserved. The codes documented in this report are preliminary and upon coder review may  be revised to meet current compliance requirements. Marcus [name]. Phoenix Dresser, MD Norvel Richards, MD 08/21/2017 2:05:07 PM This report has been signed electronically. Number of Addenda: 0

## 2017-08-21 NOTE — H&P (Signed)
@LOGO @   Primary Care Physician:  Sharilyn Sites, MD Primary Gastroenterologist:  Dr. Gala Romney  Pre-Procedure History & Physical: HPI:  Marcus [name] is a 66 y.o. male is here for a screening colonoscopy. Negative colonoscopy 2008.  No bowel symptoms currently.  Past Medical History:  Diagnosis Date  . Hypertension     Past Surgical History:  Procedure Laterality Date  . CHOLECYSTECTOMY    . COLONOSCOPY      Prior to Admission medications   Medication Sig Start Date End Date Taking? Authorizing Provider  amLODipine (NORVASC) 5 MG tablet Take 5 mg by mouth daily.   Yes [provider]  Multiple Vitamin (MULTIVITAMIN WITH MINERALS) TABS tablet Take 1 tablet by mouth daily.   Yes [provider]  Polyethyl Glycol-Propyl Glycol (LUBRICANT EYE DROPS) 0.4-0.3 % SOLN Place 1 drop into both eyes 3 (three) times daily as needed (for dry/irritated eyes).   Yes [provider]  polyethylene glycol (MIRALAX / GLYCOLAX) packet Take 17 g by mouth daily.   Yes [provider]  polyethylene glycol-electrolytes (TRILYTE) 420 g solution Take 4,000 mLs by mouth as directed. 07/29/17  Yes Annitta Needs, NP  tamsulosin (FLOMAX) 0.4 MG CAPS capsule Take 0.4 mg by mouth daily. 06/03/17  Yes [provider]    Allergies as of 07/29/2017  . (No Known Allergies)    Family History  Problem Relation Age of Onset  . Parkinson's disease Mother   . Congestive Heart Failure Father     Social History   Socioeconomic History  . Marital status: Married    Spouse name: Not on file  . Number of children: Not on file  . Years of education: Not on file  . Highest education level: Not on file  Occupational History  . Not on file  Social Needs  . Financial resource strain: Not on file  . Food insecurity:    Worry: Not on file    Inability: Not on file  . Transportation needs:    Medical: Not on file    Non-medical: Not on file  Tobacco Use  . Smoking  status: Never Smoker  . Smokeless tobacco: Never Used  Substance and Sexual Activity  . Alcohol use: No  . Drug use: No  . Sexual activity: Not on file  Lifestyle  . Physical activity:    Days per week: Not on file    Minutes per session: Not on file  . Stress: Not on file  Relationships  . Social connections:    Talks on phone: Not on file    Gets together: Not on file    Attends religious service: Not on file    Active member of club or organization: Not on file    Attends meetings of clubs or organizations: Not on file    Relationship status: Not on file  . Intimate partner violence:    Fear of current or ex partner: Not on file    Emotionally abused: Not on file    Physically abused: Not on file    Forced sexual activity: Not on file  Other Topics Concern  . Not on file  Social History Narrative  . Not on file    Review of Systems: See HPI, otherwise negative ROS  Physical Exam: BP (!) 136/94   Pulse 88   Temp 98.2 F (36.8 C) (Oral)   Resp 16   Ht 6\' 2"  (1.88 m)   Wt 237 lb (107.5 kg)   SpO2  95%   BMI 30.43 kg/m  General:   Alert,  Well-developed, well-nourished, pleasant and cooperative in NAD Lungs:  Clear throughout to auscultation.   No wheezes, crackles, or rhonchi. No acute distress. Heart:  Regular rate and rhythm; no murmurs, clicks, rubs,  or gallops. Abdomen:  Soft, nontender and nondistended. No masses, hepatosplenomegaly or hernias noted. Normal bowel sounds, without guarding, and without rebound.    Psych:  Alert and cooperative. Normal mood and affect.  Impression/Plan: Marcus [name] is now here to undergo a screening colonoscopy.  Average risk screening examination.  Risks, benefits, limitations, imponderables and alternatives regarding colonoscopy have been reviewed with the patient. Questions have been answered. All parties agreeable.     Notice:  This dictation was prepared with Dragon dictation along with smaller phrase technology.  Any transcriptional errors that result from this process are unintentional and may not be corrected upon review.

## 2017-08-26 ENCOUNTER — Encounter (HOSPITAL_COMMUNITY): Payer: Self-pay | Admitting: Internal Medicine

## 2017-11-25 ENCOUNTER — Ambulatory Visit (INDEPENDENT_AMBULATORY_CARE_PROVIDER_SITE_OTHER): Payer: Medicare Other | Admitting: Otolaryngology

## 2017-11-25 DIAGNOSIS — H6123 Impacted cerumen, bilateral: Secondary | ICD-10-CM

## 2017-11-25 DIAGNOSIS — H7202 Central perforation of tympanic membrane, left ear: Secondary | ICD-10-CM | POA: Diagnosis not present

## 2018-01-16 DIAGNOSIS — R7309 Other abnormal glucose: Secondary | ICD-10-CM | POA: Diagnosis not present

## 2018-01-16 DIAGNOSIS — Z1389 Encounter for screening for other disorder: Secondary | ICD-10-CM | POA: Diagnosis not present

## 2018-01-16 DIAGNOSIS — Z0000 Encounter for general adult medical examination without abnormal findings: Secondary | ICD-10-CM | POA: Diagnosis not present

## 2018-01-16 DIAGNOSIS — Z0001 Encounter for general adult medical examination with abnormal findings: Secondary | ICD-10-CM | POA: Diagnosis not present

## 2018-03-18 DIAGNOSIS — H524 Presbyopia: Secondary | ICD-10-CM | POA: Diagnosis not present

## 2018-04-17 DIAGNOSIS — N183 Chronic kidney disease, stage 3 (moderate): Secondary | ICD-10-CM | POA: Diagnosis not present

## 2018-04-17 DIAGNOSIS — I129 Hypertensive chronic kidney disease with stage 1 through stage 4 chronic kidney disease, or unspecified chronic kidney disease: Secondary | ICD-10-CM | POA: Diagnosis not present

## 2018-04-22 ENCOUNTER — Other Ambulatory Visit: Payer: Self-pay | Admitting: Nephrology

## 2018-04-22 DIAGNOSIS — N183 Chronic kidney disease, stage 3 unspecified: Secondary | ICD-10-CM

## 2018-04-29 ENCOUNTER — Ambulatory Visit
Admission: RE | Admit: 2018-04-29 | Discharge: 2018-04-29 | Disposition: A | Discharge status: Home / Self Care | Payer: Medicare Other | Source: Ambulatory Visit | Attending: Nephrology | Admitting: Nephrology

## 2018-04-29 DIAGNOSIS — N281 Cyst of kidney, acquired: Secondary | ICD-10-CM | POA: Diagnosis not present

## 2018-04-29 DIAGNOSIS — N183 Chronic kidney disease, stage 3 unspecified: Secondary | ICD-10-CM

## 2018-04-29 DIAGNOSIS — N189 Chronic kidney disease, unspecified: Secondary | ICD-10-CM | POA: Diagnosis not present

## 2018-05-19 ENCOUNTER — Ambulatory Visit (INDEPENDENT_AMBULATORY_CARE_PROVIDER_SITE_OTHER): Payer: Medicare Other | Admitting: Otolaryngology

## 2018-05-19 DIAGNOSIS — H6123 Impacted cerumen, bilateral: Secondary | ICD-10-CM

## 2018-05-19 DIAGNOSIS — H7203 Central perforation of tympanic membrane, bilateral: Secondary | ICD-10-CM

## 2018-06-25 DIAGNOSIS — H0014 Chalazion left upper eyelid: Secondary | ICD-10-CM | POA: Diagnosis not present

## 2018-06-25 DIAGNOSIS — L719 Rosacea, unspecified: Secondary | ICD-10-CM | POA: Diagnosis not present

## 2018-06-25 DIAGNOSIS — H0288B Meibomian gland dysfunction left eye, upper and lower eyelids: Secondary | ICD-10-CM | POA: Diagnosis not present

## 2018-06-25 DIAGNOSIS — H0288A Meibomian gland dysfunction right eye, upper and lower eyelids: Secondary | ICD-10-CM | POA: Diagnosis not present

## 2018-07-22 DIAGNOSIS — H0014 Chalazion left upper eyelid: Secondary | ICD-10-CM | POA: Diagnosis not present

## 2018-07-22 DIAGNOSIS — L719 Rosacea, unspecified: Secondary | ICD-10-CM | POA: Diagnosis not present

## 2018-07-22 DIAGNOSIS — H0288B Meibomian gland dysfunction left eye, upper and lower eyelids: Secondary | ICD-10-CM | POA: Diagnosis not present

## 2018-07-22 DIAGNOSIS — H0288A Meibomian gland dysfunction right eye, upper and lower eyelids: Secondary | ICD-10-CM | POA: Diagnosis not present

## 2018-08-04 DIAGNOSIS — N183 Chronic kidney disease, stage 3 (moderate): Secondary | ICD-10-CM | POA: Diagnosis not present

## 2018-08-14 DIAGNOSIS — I129 Hypertensive chronic kidney disease with stage 1 through stage 4 chronic kidney disease, or unspecified chronic kidney disease: Secondary | ICD-10-CM | POA: Diagnosis not present

## 2018-08-14 DIAGNOSIS — E559 Vitamin D deficiency, unspecified: Secondary | ICD-10-CM | POA: Diagnosis not present

## 2018-08-14 DIAGNOSIS — N183 Chronic kidney disease, stage 3 (moderate): Secondary | ICD-10-CM | POA: Diagnosis not present

## 2018-08-15 IMAGING — US US THYROID
1 series · 12 of 25 positions shown · non-contrast
Comparison: None.

CLINICAL DATA: 65-year-old male with a history of multinodular
thyroid

EXAM:
THYROID ULTRASOUND
TECHNIQUE: Ultrasound examination of the thyroid gland and adjacent soft
tissues was performed.

[Series 1: us thyroid · 0.06mm/px · 12 of 76 slices shown]
[im 4/76]
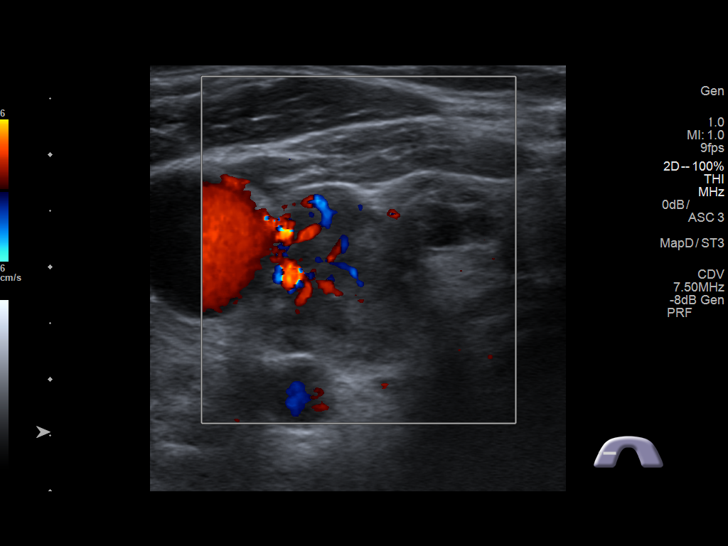
[im 10/76]
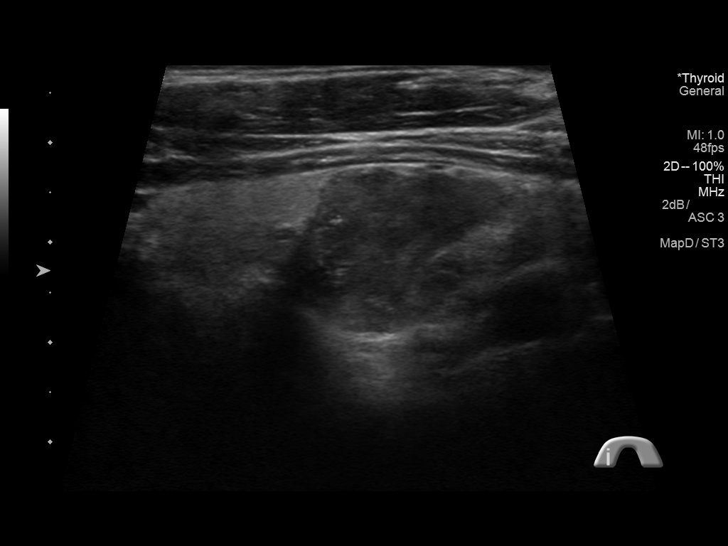
[im 16/76]
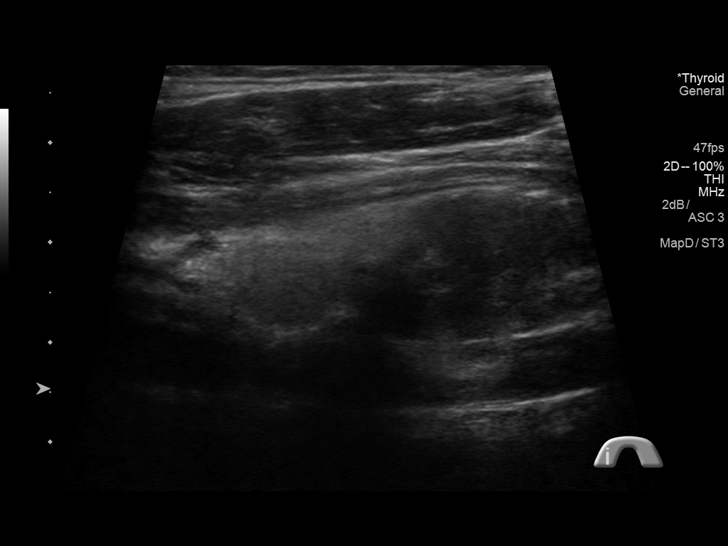
[im 22/76]
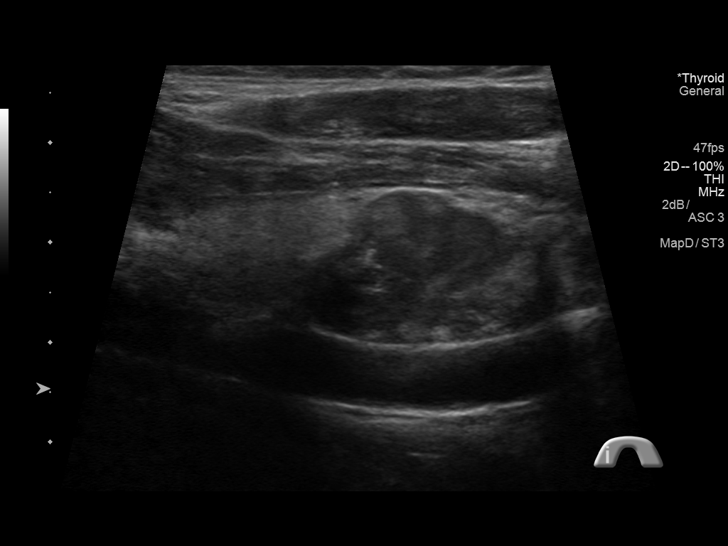
[im 29/76]
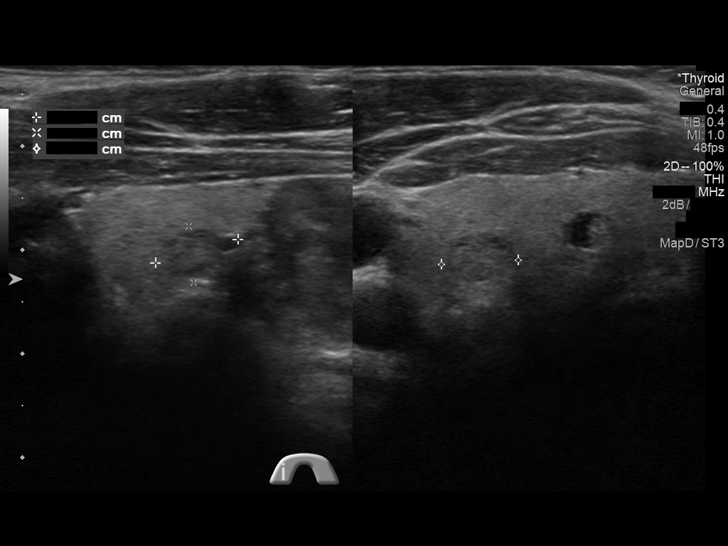
[im 35/76]
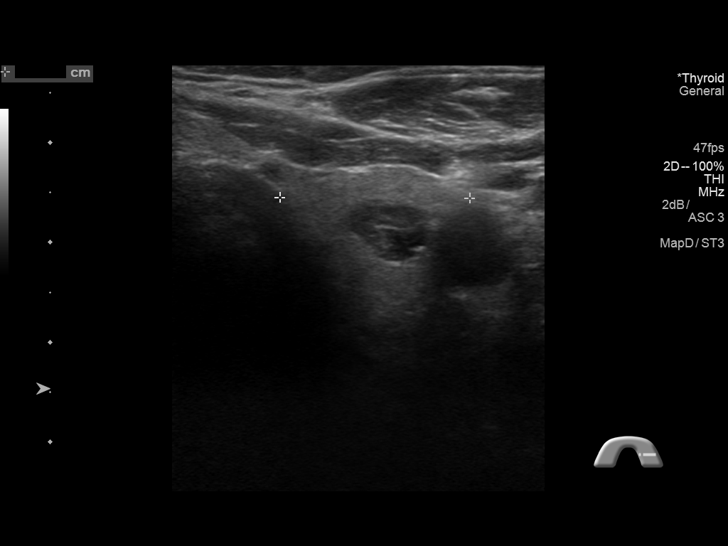
[im 41/76]
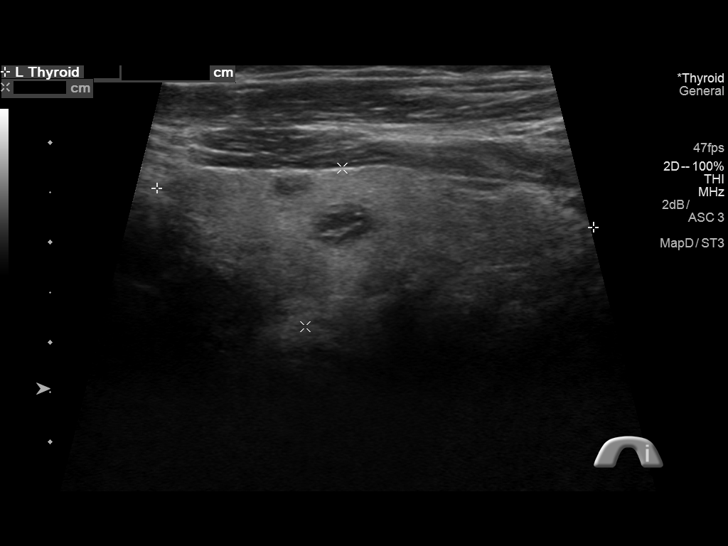
[im 47/76]
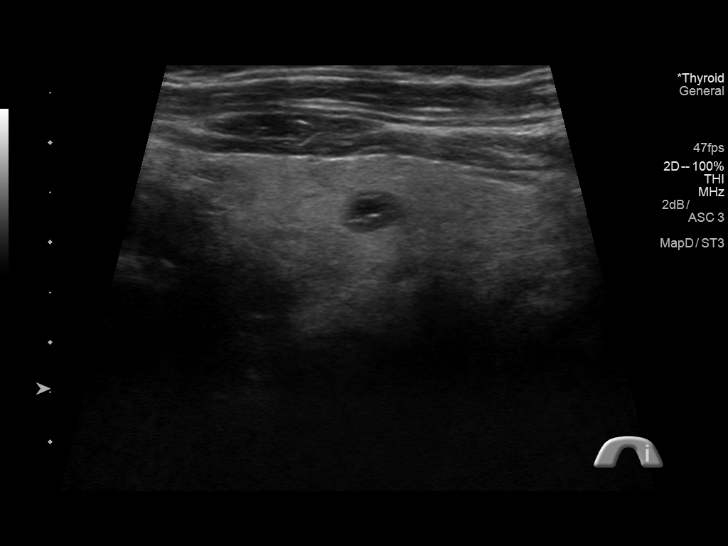
[im 54/76]
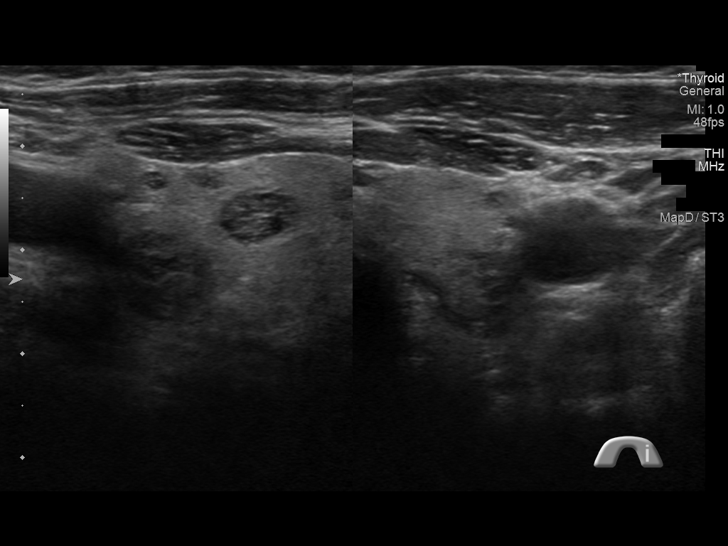
[im 60/76]
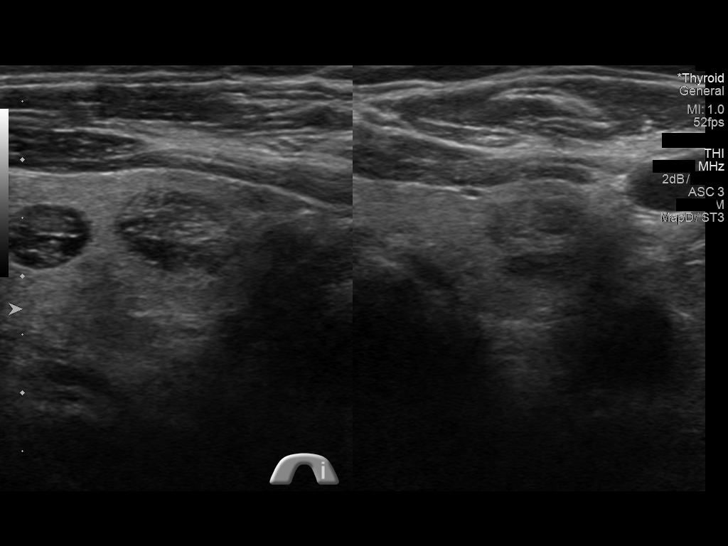
[im 66/76]
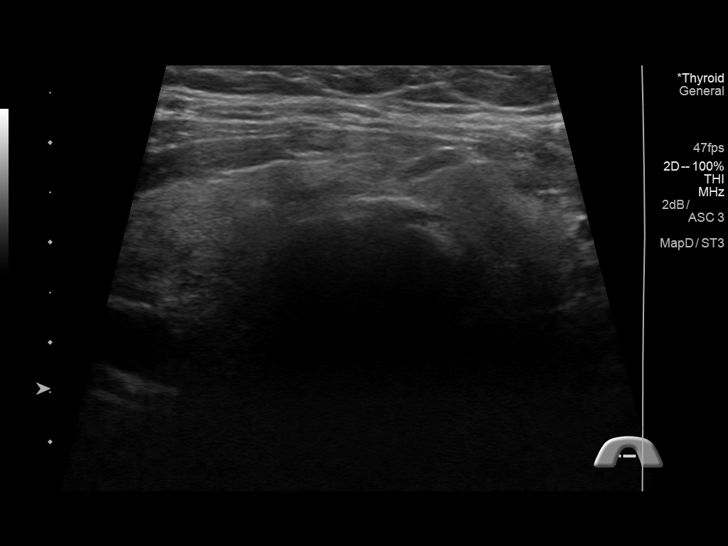
[im 72/76]
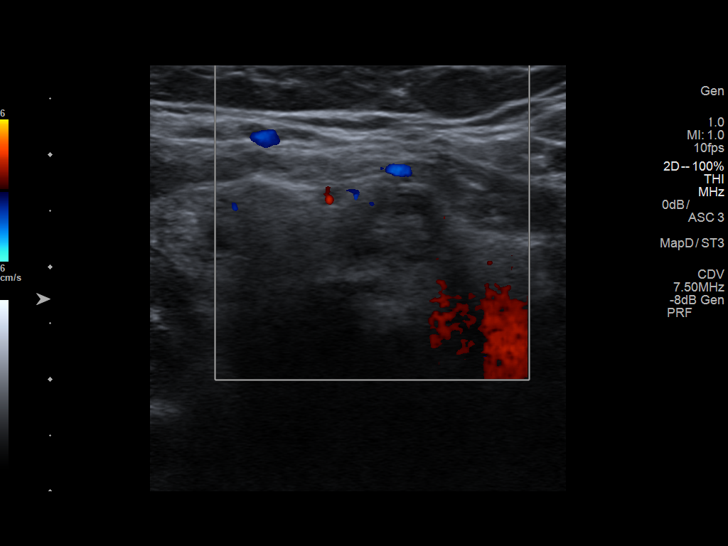

[12 of 25 positions shown; findings below may reference images not displayed]

FINDINGS: Parenchymal Echotexture: Mildly heterogenous

Isthmus: 1.0 cm

Right lobe: 4.8 cm x 1.8 cm x 2.4 cm

Left lobe: 4.4 cm x 1.6 cm x 1.9 cm

_________________________________________________________

Estimated total number of nodules >/= 1 cm: 5

Number of spongiform nodules >/=  2 cm not described below (TR1): 0

Number of mixed cystic and solid nodules >/= 1.5 cm not described
below (TR2): 0

Nodule # 1:

Location: Isthmus; Mid

Maximum size: 1.2 cm; Other 2 dimensions: 0.7 cm x 1.1 cm

Composition: mixed cystic and solid (1)

Echogenicity: isoechoic (1)

Shape: not taller-than-wide (0)

Margins: smooth (0)

Echogenic foci: none (0)

ACR TI-RADS total points: 2.

ACR TI-RADS risk category: TR2 (2 points).

ACR TI-RADS recommendations:

Nodule does not meet criteria for surveillance or biopsy

Nodule # 1:

Location: Right; Inferior

Maximum size: 2.8 cm; Other 2 dimensions: 1.6 cm x 1.7 cm

Composition: solid/almost completely solid (2)

Echogenicity: hypoechoic (2)

Shape: taller-than-wide (3)

Margins: lobulated/irregular (2)

Echogenic foci: none (0)

ACR TI-RADS total points: 9.

ACR TI-RADS risk category: TR5 (>/= 7 points).

ACR TI-RADS recommendations:

Nodule meets criteria for biopsy

Nodule # 2:

Location: Right; Inferior

Maximum size: 1.9 cm; Other 2 dimensions: 1.4 cm x 1.5 cm

Composition: mixed cystic and solid (1)

Echogenicity: hypoechoic (2)

Shape: not taller-than-wide (0)

Margins: ill-defined (0)

Echogenic foci: none (0)

ACR TI-RADS total points: 3.

ACR TI-RADS risk category: TR3 (3 points).

ACR TI-RADS recommendations:

Nodule meets criteria for surveillance

Nodule # 3:

Location: Right; Mid

Maximum size: 0.8 cm; Other 2 dimensions: 0.7 cm x 0.5 cm

Composition: spongiform (0)

ACR TI-RADS recommendations:

Spongiform nodule does not require surveillance or biopsy

Nodule # 1:

Location: Left; Mid

Maximum size: 1.1 cm; Other 2 dimensions: 0.9 cm x 1.1 cm

Composition: mixed cystic and solid (1)

Echogenicity: isoechoic (1)

Shape: not taller-than-wide (0)

Margins: ill-defined (0)

Echogenic foci: none (0)

ACR TI-RADS total points: 2.

ACR TI-RADS risk category: TR2 (2 points).

ACR TI-RADS recommendations:

Nodule does not meet criteria for surveillance or biopsy

Nodule # 2:

Location: Left; Mid

Maximum size: 0.9 cm; Other 2 dimensions: 0.6 cm x 0.9 cm

Composition: spongiform (0)

ACR TI-RADS recommendations:

Spongiform nodule does not meet criteria for surveillance or biopsy

Nodule # 3:

Location: Left; Inferior

Maximum size: 1.3 cm; Other 2 dimensions: 0.8 cm x 1.1 cm

Composition: spongiform (0)

ACR TI-RADS recommendations:

Spongiform nodule does not meet criteria for surveillance or biopsy

Nodule # 4:

Location: Left; Mid

Maximum size: 0.6 cm; Other 2 dimensions: 0.5 cm x 0.3 cm

Composition: spongiform (0)

ACR TI-RADS recommendations:

Spongiform nodule does not require surveillance or biopsy.
IMPRESSION: The right thyroid nodule labeled 1 (2.8 cm) meets criteria for
biopsy, as designated by the newly established ACR TI-RADS criteria,
and referral for biopsy is recommended.

Right inferior thyroid nodule labeled 2 meets criteria for
surveillance. Surveillance ultrasound study recommended to be
performed annually up to 5 years.

Remainder of nodules do not require surveillance or biopsy.

Recommendations follow those established by the new ACR TI-RADS
criteria ([HOSPITAL] 9005;[DATE]).

## 2018-08-26 DIAGNOSIS — N181 Chronic kidney disease, stage 1: Secondary | ICD-10-CM | POA: Diagnosis not present

## 2018-08-26 DIAGNOSIS — Z1389 Encounter for screening for other disorder: Secondary | ICD-10-CM | POA: Diagnosis not present

## 2018-08-26 DIAGNOSIS — Z0001 Encounter for general adult medical examination with abnormal findings: Secondary | ICD-10-CM | POA: Diagnosis not present

## 2018-09-03 DIAGNOSIS — H0288A Meibomian gland dysfunction right eye, upper and lower eyelids: Secondary | ICD-10-CM | POA: Diagnosis not present

## 2018-09-03 DIAGNOSIS — H0288B Meibomian gland dysfunction left eye, upper and lower eyelids: Secondary | ICD-10-CM | POA: Diagnosis not present

## 2018-09-03 DIAGNOSIS — L719 Rosacea, unspecified: Secondary | ICD-10-CM | POA: Diagnosis not present

## 2018-09-03 DIAGNOSIS — H0014 Chalazion left upper eyelid: Secondary | ICD-10-CM | POA: Diagnosis not present

## 2018-11-17 ENCOUNTER — Other Ambulatory Visit: Payer: Self-pay

## 2018-11-17 ENCOUNTER — Ambulatory Visit (INDEPENDENT_AMBULATORY_CARE_PROVIDER_SITE_OTHER): Payer: Medicare Other | Admitting: Otolaryngology

## 2018-11-17 DIAGNOSIS — D440 Neoplasm of uncertain behavior of thyroid gland: Secondary | ICD-10-CM | POA: Diagnosis not present

## 2018-11-17 DIAGNOSIS — H6123 Impacted cerumen, bilateral: Secondary | ICD-10-CM

## 2018-11-17 DIAGNOSIS — H6983 Other specified disorders of Eustachian tube, bilateral: Secondary | ICD-10-CM | POA: Diagnosis not present

## 2018-11-17 DIAGNOSIS — H7203 Central perforation of tympanic membrane, bilateral: Secondary | ICD-10-CM | POA: Diagnosis not present

## 2018-11-19 DIAGNOSIS — L719 Rosacea, unspecified: Secondary | ICD-10-CM | POA: Diagnosis not present

## 2018-11-19 DIAGNOSIS — H0102A Squamous blepharitis right eye, upper and lower eyelids: Secondary | ICD-10-CM | POA: Diagnosis not present

## 2018-11-19 DIAGNOSIS — H0102B Squamous blepharitis left eye, upper and lower eyelids: Secondary | ICD-10-CM | POA: Diagnosis not present

## 2018-11-19 DIAGNOSIS — H0014 Chalazion left upper eyelid: Secondary | ICD-10-CM | POA: Diagnosis not present

## 2018-11-20 DIAGNOSIS — L821 Other seborrheic keratosis: Secondary | ICD-10-CM | POA: Diagnosis not present

## 2018-11-20 DIAGNOSIS — D225 Melanocytic nevi of trunk: Secondary | ICD-10-CM | POA: Diagnosis not present

## 2018-11-20 DIAGNOSIS — X32XXXD Exposure to sunlight, subsequent encounter: Secondary | ICD-10-CM | POA: Diagnosis not present

## 2018-11-20 DIAGNOSIS — L820 Inflamed seborrheic keratosis: Secondary | ICD-10-CM | POA: Diagnosis not present

## 2018-11-20 DIAGNOSIS — D0359 Melanoma in situ of other part of trunk: Secondary | ICD-10-CM | POA: Diagnosis not present

## 2018-11-20 DIAGNOSIS — L570 Actinic keratosis: Secondary | ICD-10-CM | POA: Diagnosis not present

## 2018-12-04 DIAGNOSIS — D225 Melanocytic nevi of trunk: Secondary | ICD-10-CM | POA: Diagnosis not present

## 2018-12-04 DIAGNOSIS — D0359 Melanoma in situ of other part of trunk: Secondary | ICD-10-CM | POA: Diagnosis not present

## 2018-12-04 DIAGNOSIS — D485 Neoplasm of uncertain behavior of skin: Secondary | ICD-10-CM | POA: Diagnosis not present

## 2018-12-04 DIAGNOSIS — I788 Other diseases of capillaries: Secondary | ICD-10-CM | POA: Diagnosis not present

## 2018-12-11 DIAGNOSIS — N181 Chronic kidney disease, stage 1: Secondary | ICD-10-CM | POA: Diagnosis not present

## 2018-12-11 DIAGNOSIS — I872 Venous insufficiency (chronic) (peripheral): Secondary | ICD-10-CM | POA: Diagnosis not present

## 2018-12-11 DIAGNOSIS — Z13220 Encounter for screening for lipoid disorders: Secondary | ICD-10-CM | POA: Diagnosis not present

## 2018-12-11 DIAGNOSIS — R600 Localized edema: Secondary | ICD-10-CM | POA: Diagnosis not present

## 2019-01-19 DIAGNOSIS — N181 Chronic kidney disease, stage 1: Secondary | ICD-10-CM | POA: Diagnosis not present

## 2019-01-19 DIAGNOSIS — I10 Essential (primary) hypertension: Secondary | ICD-10-CM | POA: Diagnosis not present

## 2019-01-19 DIAGNOSIS — Z23 Encounter for immunization: Secondary | ICD-10-CM | POA: Diagnosis not present

## 2019-03-09 DIAGNOSIS — N1831 Chronic kidney disease, stage 3a: Secondary | ICD-10-CM | POA: Diagnosis not present

## 2019-03-09 DIAGNOSIS — I129 Hypertensive chronic kidney disease with stage 1 through stage 4 chronic kidney disease, or unspecified chronic kidney disease: Secondary | ICD-10-CM | POA: Diagnosis not present

## 2019-04-08 ENCOUNTER — Ambulatory Visit: Payer: Medicare Other | Attending: Internal Medicine

## 2019-04-08 ENCOUNTER — Other Ambulatory Visit: Payer: Self-pay

## 2019-04-08 DIAGNOSIS — Z20822 Contact with and (suspected) exposure to covid-19: Secondary | ICD-10-CM

## 2019-04-09 DIAGNOSIS — I129 Hypertensive chronic kidney disease with stage 1 through stage 4 chronic kidney disease, or unspecified chronic kidney disease: Secondary | ICD-10-CM | POA: Diagnosis not present

## 2019-04-09 DIAGNOSIS — N1831 Chronic kidney disease, stage 3a: Secondary | ICD-10-CM | POA: Diagnosis not present

## 2019-04-09 LAB — NOVEL CORONAVIRUS, NAA: SARS-CoV-2, NAA: NOT DETECTED

## 2019-04-21 DIAGNOSIS — J22 Unspecified acute lower respiratory infection: Secondary | ICD-10-CM | POA: Diagnosis not present

## 2019-05-01 DIAGNOSIS — H6123 Impacted cerumen, bilateral: Secondary | ICD-10-CM | POA: Diagnosis not present

## 2019-05-01 DIAGNOSIS — H906 Mixed conductive and sensorineural hearing loss, bilateral: Secondary | ICD-10-CM | POA: Diagnosis not present

## 2019-05-01 DIAGNOSIS — H7201 Central perforation of tympanic membrane, right ear: Secondary | ICD-10-CM | POA: Diagnosis not present

## 2019-07-08 DIAGNOSIS — I129 Hypertensive chronic kidney disease with stage 1 through stage 4 chronic kidney disease, or unspecified chronic kidney disease: Secondary | ICD-10-CM | POA: Diagnosis not present

## 2019-07-08 DIAGNOSIS — N1831 Chronic kidney disease, stage 3a: Secondary | ICD-10-CM | POA: Diagnosis not present

## 2019-07-29 DIAGNOSIS — H6123 Impacted cerumen, bilateral: Secondary | ICD-10-CM | POA: Diagnosis not present

## 2019-08-17 DIAGNOSIS — N183 Chronic kidney disease, stage 3 unspecified: Secondary | ICD-10-CM | POA: Diagnosis not present

## 2019-08-24 DIAGNOSIS — N1831 Chronic kidney disease, stage 3a: Secondary | ICD-10-CM | POA: Diagnosis not present

## 2019-08-24 DIAGNOSIS — E559 Vitamin D deficiency, unspecified: Secondary | ICD-10-CM | POA: Diagnosis not present

## 2019-08-24 DIAGNOSIS — I129 Hypertensive chronic kidney disease with stage 1 through stage 4 chronic kidney disease, or unspecified chronic kidney disease: Secondary | ICD-10-CM | POA: Diagnosis not present

## 2019-09-10 DIAGNOSIS — Z0000 Encounter for general adult medical examination without abnormal findings: Secondary | ICD-10-CM | POA: Diagnosis not present

## 2019-09-10 DIAGNOSIS — Z1389 Encounter for screening for other disorder: Secondary | ICD-10-CM | POA: Diagnosis not present

## 2019-09-10 DIAGNOSIS — M5430 Sciatica, unspecified side: Secondary | ICD-10-CM | POA: Diagnosis not present

## 2019-10-05 DIAGNOSIS — H524 Presbyopia: Secondary | ICD-10-CM | POA: Diagnosis not present

## 2019-10-28 DIAGNOSIS — H6123 Impacted cerumen, bilateral: Secondary | ICD-10-CM | POA: Diagnosis not present

## 2019-11-06 DIAGNOSIS — N1831 Chronic kidney disease, stage 3a: Secondary | ICD-10-CM | POA: Diagnosis not present

## 2019-11-06 DIAGNOSIS — I129 Hypertensive chronic kidney disease with stage 1 through stage 4 chronic kidney disease, or unspecified chronic kidney disease: Secondary | ICD-10-CM | POA: Diagnosis not present

## 2019-11-26 DIAGNOSIS — Z85820 Personal history of malignant melanoma of skin: Secondary | ICD-10-CM | POA: Diagnosis not present

## 2019-11-26 DIAGNOSIS — L821 Other seborrheic keratosis: Secondary | ICD-10-CM | POA: Diagnosis not present

## 2019-11-26 DIAGNOSIS — D225 Melanocytic nevi of trunk: Secondary | ICD-10-CM | POA: Diagnosis not present

## 2019-11-26 DIAGNOSIS — L218 Other seborrheic dermatitis: Secondary | ICD-10-CM | POA: Diagnosis not present

## 2019-11-26 DIAGNOSIS — D224 Melanocytic nevi of scalp and neck: Secondary | ICD-10-CM | POA: Diagnosis not present

## 2019-11-26 DIAGNOSIS — Z08 Encounter for follow-up examination after completed treatment for malignant neoplasm: Secondary | ICD-10-CM | POA: Diagnosis not present

## 2019-11-26 DIAGNOSIS — L820 Inflamed seborrheic keratosis: Secondary | ICD-10-CM | POA: Diagnosis not present

## 2019-12-08 DIAGNOSIS — I129 Hypertensive chronic kidney disease with stage 1 through stage 4 chronic kidney disease, or unspecified chronic kidney disease: Secondary | ICD-10-CM | POA: Diagnosis not present

## 2019-12-08 DIAGNOSIS — N1831 Chronic kidney disease, stage 3a: Secondary | ICD-10-CM | POA: Diagnosis not present

## 2020-01-27 DIAGNOSIS — H6123 Impacted cerumen, bilateral: Secondary | ICD-10-CM | POA: Diagnosis not present

## 2020-02-26 DIAGNOSIS — Z681 Body mass index (BMI) 19 or less, adult: Secondary | ICD-10-CM | POA: Diagnosis not present

## 2020-02-26 DIAGNOSIS — J329 Chronic sinusitis, unspecified: Secondary | ICD-10-CM | POA: Diagnosis not present

## 2020-03-08 DIAGNOSIS — I129 Hypertensive chronic kidney disease with stage 1 through stage 4 chronic kidney disease, or unspecified chronic kidney disease: Secondary | ICD-10-CM | POA: Diagnosis not present

## 2020-03-08 DIAGNOSIS — N1831 Chronic kidney disease, stage 3a: Secondary | ICD-10-CM | POA: Diagnosis not present

## 2020-04-08 DIAGNOSIS — I129 Hypertensive chronic kidney disease with stage 1 through stage 4 chronic kidney disease, or unspecified chronic kidney disease: Secondary | ICD-10-CM | POA: Diagnosis not present

## 2020-04-08 DIAGNOSIS — N1831 Chronic kidney disease, stage 3a: Secondary | ICD-10-CM | POA: Diagnosis not present

## 2020-04-28 DIAGNOSIS — H6123 Impacted cerumen, bilateral: Secondary | ICD-10-CM | POA: Diagnosis not present

## 2020-05-07 DIAGNOSIS — N1831 Chronic kidney disease, stage 3a: Secondary | ICD-10-CM | POA: Diagnosis not present

## 2020-05-07 DIAGNOSIS — I129 Hypertensive chronic kidney disease with stage 1 through stage 4 chronic kidney disease, or unspecified chronic kidney disease: Secondary | ICD-10-CM | POA: Diagnosis not present

## 2020-07-06 DIAGNOSIS — N1831 Chronic kidney disease, stage 3a: Secondary | ICD-10-CM | POA: Diagnosis not present

## 2020-07-06 DIAGNOSIS — I129 Hypertensive chronic kidney disease with stage 1 through stage 4 chronic kidney disease, or unspecified chronic kidney disease: Secondary | ICD-10-CM | POA: Diagnosis not present

## 2020-07-12 DIAGNOSIS — Z681 Body mass index (BMI) 19 or less, adult: Secondary | ICD-10-CM | POA: Diagnosis not present

## 2020-07-12 DIAGNOSIS — J301 Allergic rhinitis due to pollen: Secondary | ICD-10-CM | POA: Diagnosis not present

## 2020-07-12 DIAGNOSIS — J019 Acute sinusitis, unspecified: Secondary | ICD-10-CM | POA: Diagnosis not present

## 2020-08-10 IMAGING — US US RENAL
1 series · 14 of 25 positions shown · non-contrast
Comparison: None.

CLINICAL DATA: Chronic renal disease.

EXAM:
RENAL / URINARY TRACT ULTRASOUND COMPLETE

[Series 1: us renal · 0.28mm/px · 14 of 41 slices shown]
[im 1/41]
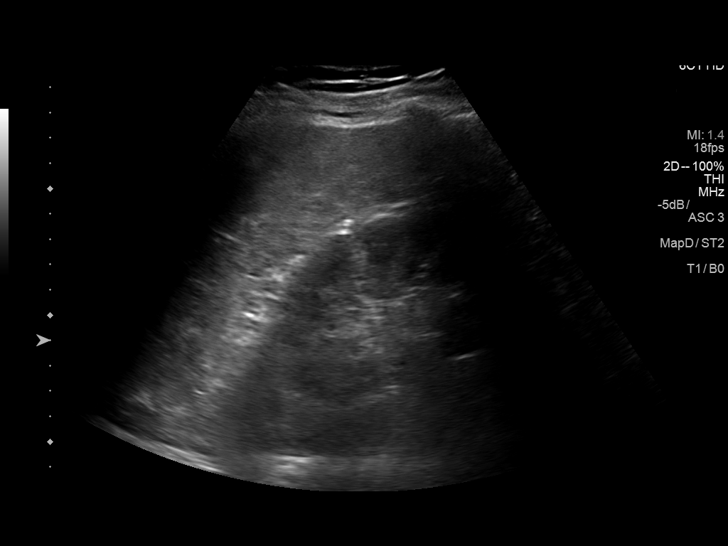
[im 4/41]
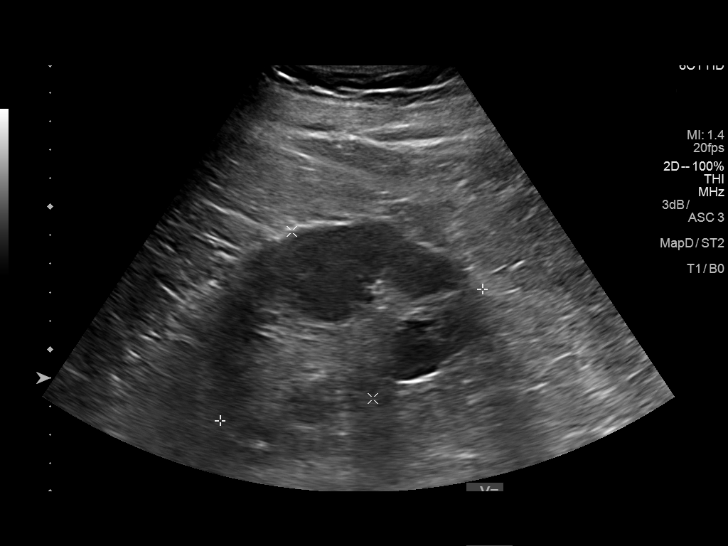
[im 7/41]
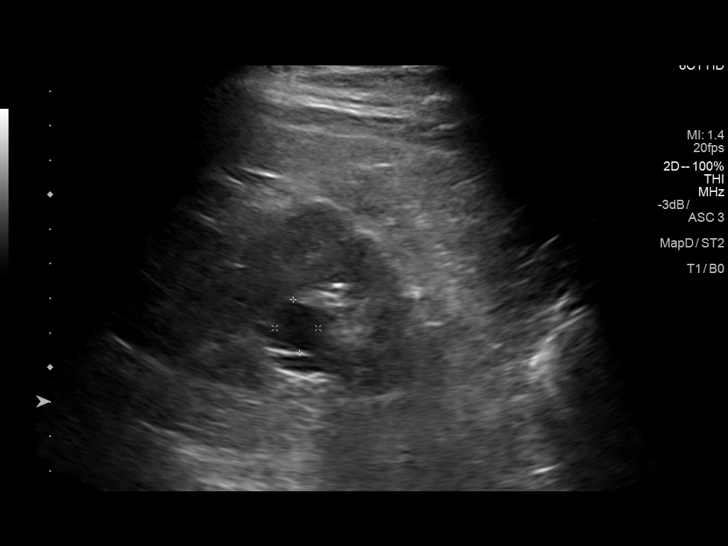
[im 11/41]
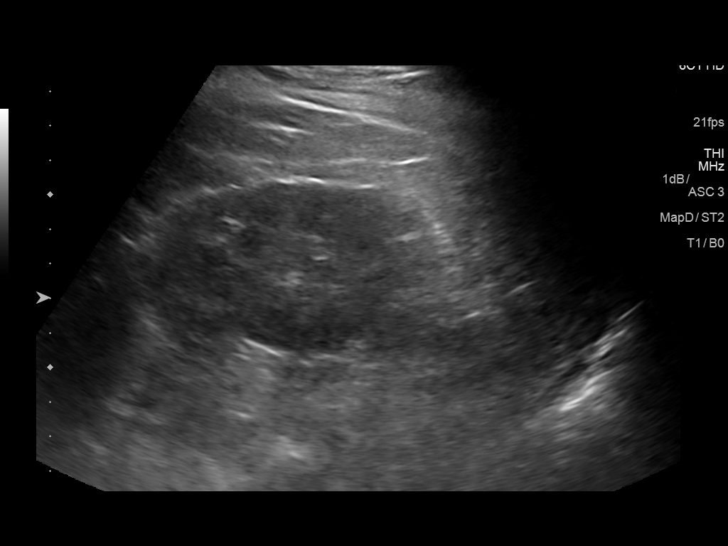
[im 14/41]
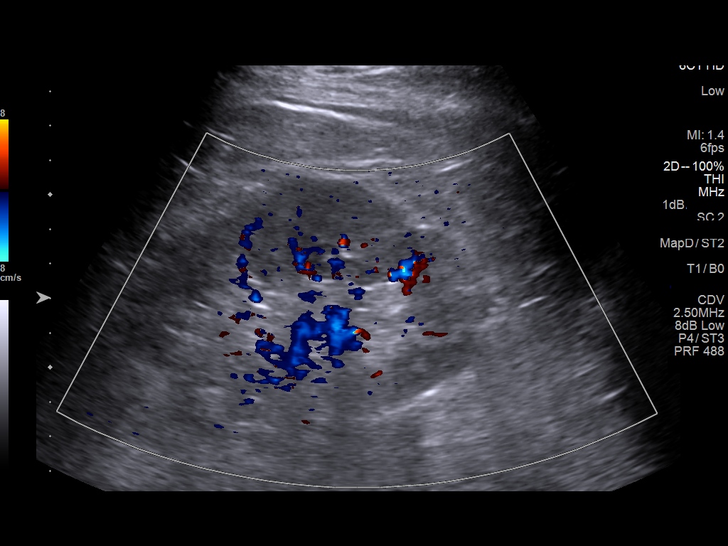
[im 16/41]
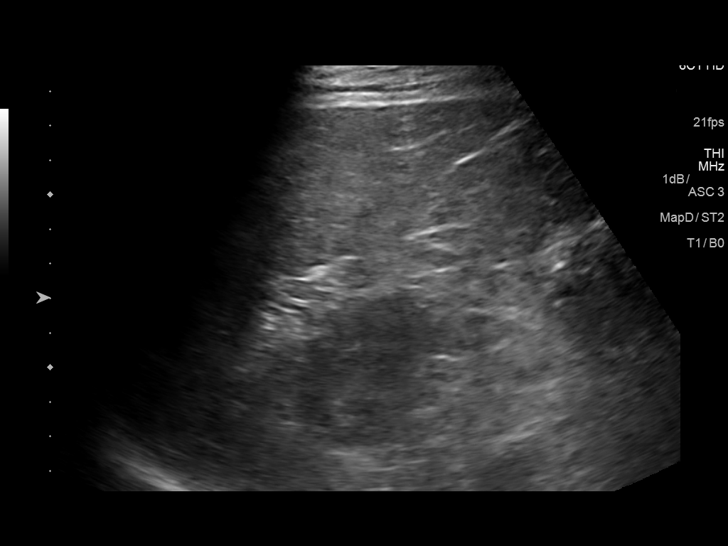
[im 19/41]
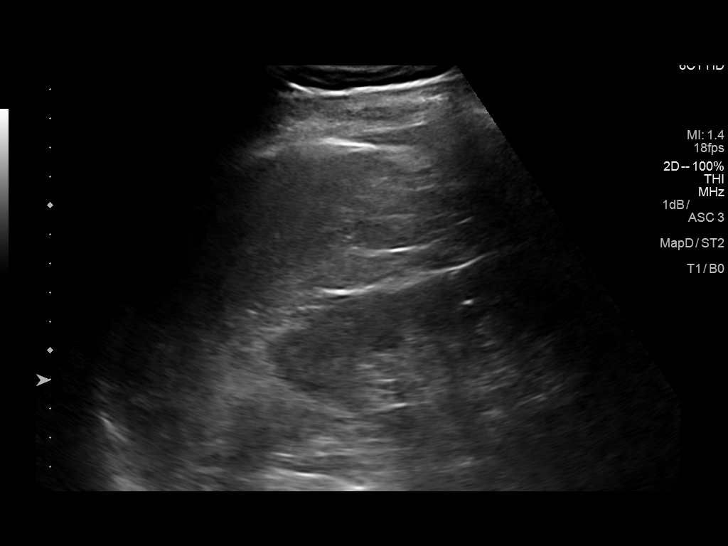
[im 22/41]
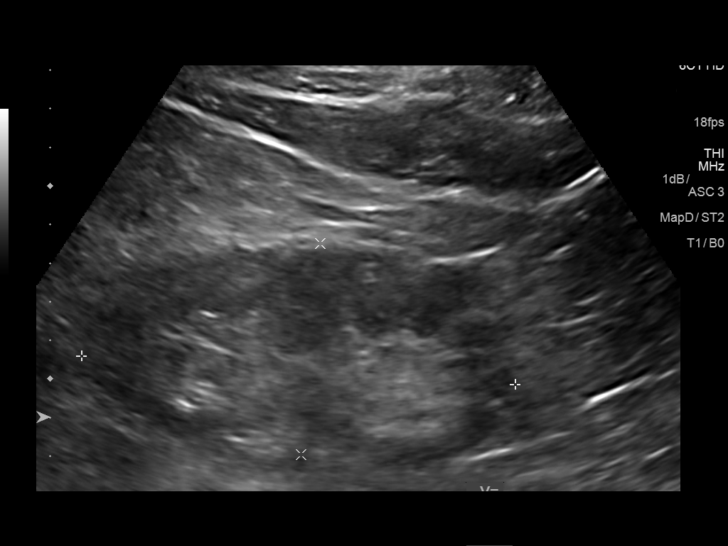
[im 26/41]
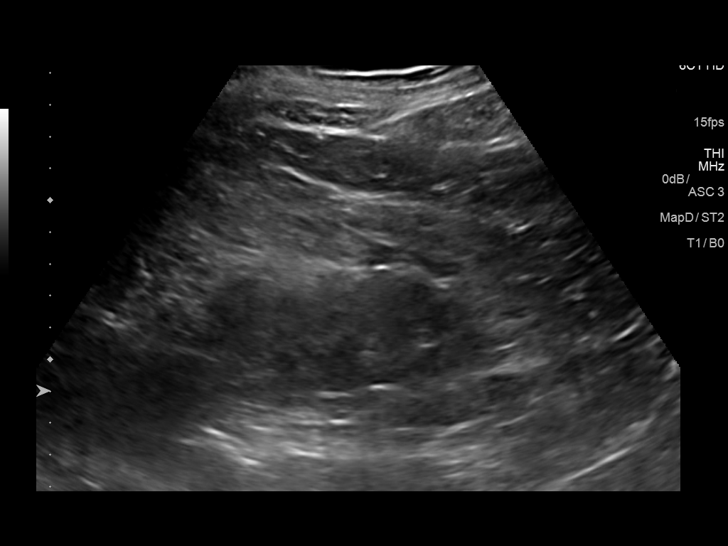
[im 27/41]
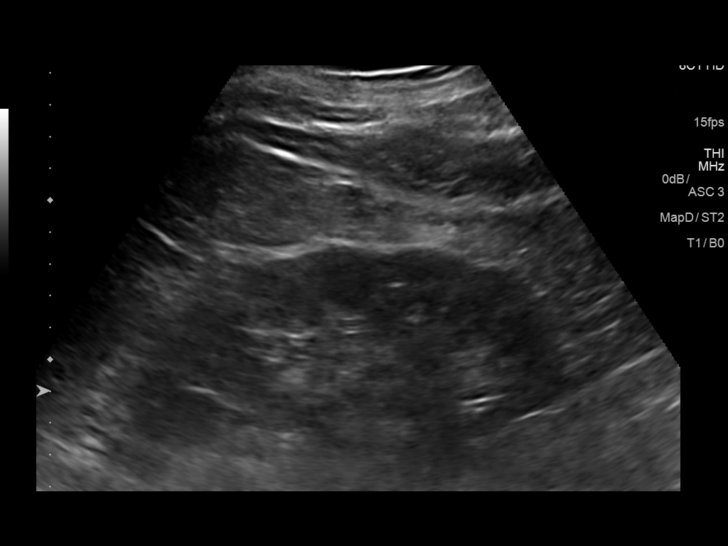
[im 31/41]
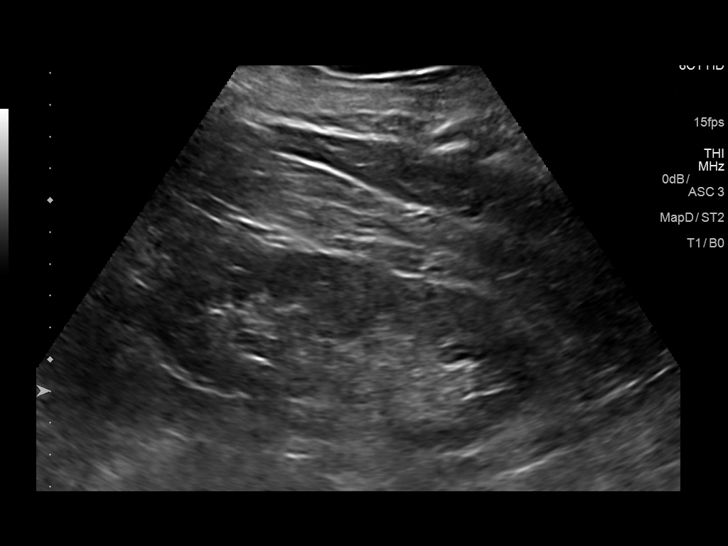
[im 34/41]
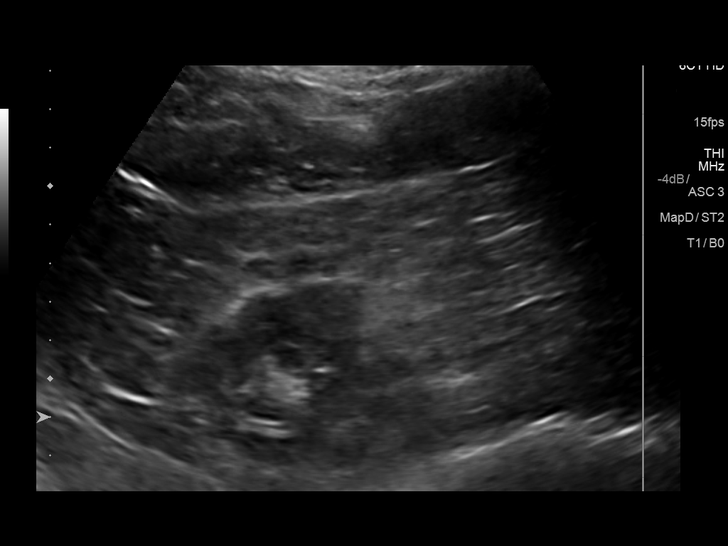
[im 37/41]
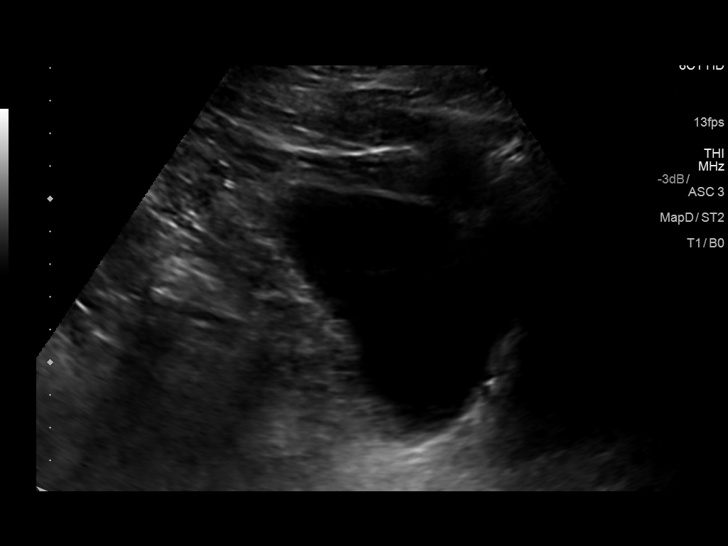
[im 41/41]
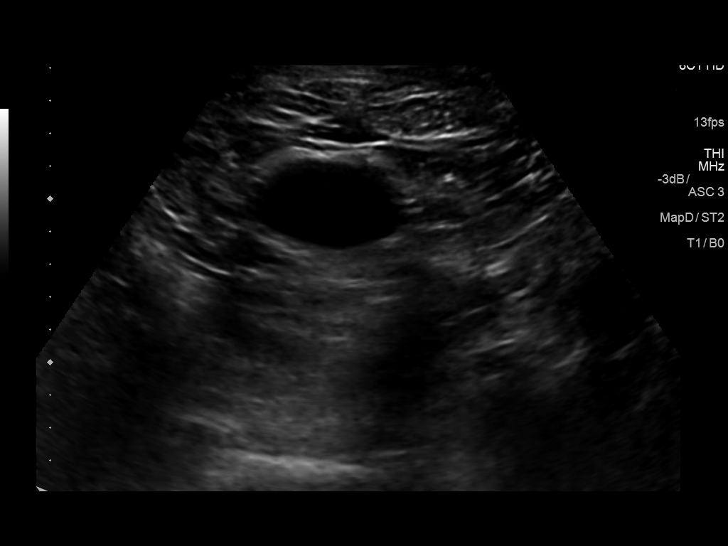

[14 of 25 positions shown; findings below may reference images not displayed]

FINDINGS: Right Kidney:

Renal measurements: 10.3 x 6.5 x 6 3 cm = volume: 220 mL .
Echogenicity within normal limits. There is a 1 7 x 1.6 x 1.3 cm
benign-appearing cyst in the lower pole of the right kidney.

Left Kidney:

Renal measurements: 11.3 x 5.5 x 5.6 cm = volume: 182 mL.
Echogenicity within normal limits. No mass or hydronephrosis
visualized.

Bladder:

Appears normal for degree of bladder distention.
IMPRESSION: 1.7 cm benign-appearing cyst in the lower pole of the right kidney.

Otherwise normal appearance of the kidneys.

## 2020-08-15 DIAGNOSIS — N1831 Chronic kidney disease, stage 3a: Secondary | ICD-10-CM | POA: Diagnosis not present

## 2020-08-23 DIAGNOSIS — E559 Vitamin D deficiency, unspecified: Secondary | ICD-10-CM | POA: Diagnosis not present

## 2020-08-23 DIAGNOSIS — I129 Hypertensive chronic kidney disease with stage 1 through stage 4 chronic kidney disease, or unspecified chronic kidney disease: Secondary | ICD-10-CM | POA: Diagnosis not present

## 2020-08-23 DIAGNOSIS — N1831 Chronic kidney disease, stage 3a: Secondary | ICD-10-CM | POA: Diagnosis not present

## 2020-08-25 DIAGNOSIS — H43392 Other vitreous opacities, left eye: Secondary | ICD-10-CM | POA: Diagnosis not present

## 2020-08-26 DIAGNOSIS — H43823 Vitreomacular adhesion, bilateral: Secondary | ICD-10-CM | POA: Diagnosis not present

## 2020-08-26 DIAGNOSIS — H43393 Other vitreous opacities, bilateral: Secondary | ICD-10-CM | POA: Diagnosis not present

## 2020-08-26 DIAGNOSIS — H31092 Other chorioretinal scars, left eye: Secondary | ICD-10-CM | POA: Diagnosis not present

## 2020-08-26 DIAGNOSIS — H2513 Age-related nuclear cataract, bilateral: Secondary | ICD-10-CM | POA: Diagnosis not present

## 2020-09-06 DIAGNOSIS — N1831 Chronic kidney disease, stage 3a: Secondary | ICD-10-CM | POA: Diagnosis not present

## 2020-09-06 DIAGNOSIS — I129 Hypertensive chronic kidney disease with stage 1 through stage 4 chronic kidney disease, or unspecified chronic kidney disease: Secondary | ICD-10-CM | POA: Diagnosis not present

## 2020-09-12 DIAGNOSIS — J069 Acute upper respiratory infection, unspecified: Secondary | ICD-10-CM | POA: Diagnosis not present

## 2020-09-12 DIAGNOSIS — Z681 Body mass index (BMI) 19 or less, adult: Secondary | ICD-10-CM | POA: Diagnosis not present

## 2020-09-23 DIAGNOSIS — H0102B Squamous blepharitis left eye, upper and lower eyelids: Secondary | ICD-10-CM | POA: Diagnosis not present

## 2020-09-23 DIAGNOSIS — H43823 Vitreomacular adhesion, bilateral: Secondary | ICD-10-CM | POA: Diagnosis not present

## 2020-09-23 DIAGNOSIS — H2513 Age-related nuclear cataract, bilateral: Secondary | ICD-10-CM | POA: Diagnosis not present

## 2020-09-23 DIAGNOSIS — H43393 Other vitreous opacities, bilateral: Secondary | ICD-10-CM | POA: Diagnosis not present

## 2020-09-23 DIAGNOSIS — H31092 Other chorioretinal scars, left eye: Secondary | ICD-10-CM | POA: Diagnosis not present

## 2020-09-23 DIAGNOSIS — H0102A Squamous blepharitis right eye, upper and lower eyelids: Secondary | ICD-10-CM | POA: Diagnosis not present

## 2020-09-23 DIAGNOSIS — L719 Rosacea, unspecified: Secondary | ICD-10-CM | POA: Diagnosis not present

## 2020-10-13 DIAGNOSIS — H2512 Age-related nuclear cataract, left eye: Secondary | ICD-10-CM | POA: Diagnosis not present

## 2020-10-13 DIAGNOSIS — H43312 Vitreous membranes and strands, left eye: Secondary | ICD-10-CM | POA: Diagnosis not present

## 2020-10-13 DIAGNOSIS — H33312 Horseshoe tear of retina without detachment, left eye: Secondary | ICD-10-CM | POA: Diagnosis not present

## 2020-10-13 DIAGNOSIS — H43392 Other vitreous opacities, left eye: Secondary | ICD-10-CM | POA: Diagnosis not present

## 2020-10-14 DIAGNOSIS — H33312 Horseshoe tear of retina without detachment, left eye: Secondary | ICD-10-CM | POA: Diagnosis not present

## 2020-10-14 DIAGNOSIS — H31092 Other chorioretinal scars, left eye: Secondary | ICD-10-CM | POA: Diagnosis not present

## 2020-10-21 DIAGNOSIS — H33312 Horseshoe tear of retina without detachment, left eye: Secondary | ICD-10-CM | POA: Diagnosis not present

## 2020-10-27 DIAGNOSIS — H6123 Impacted cerumen, bilateral: Secondary | ICD-10-CM | POA: Diagnosis not present

## 2020-11-11 DIAGNOSIS — H31092 Other chorioretinal scars, left eye: Secondary | ICD-10-CM | POA: Diagnosis not present

## 2021-03-29 DIAGNOSIS — J329 Chronic sinusitis, unspecified: Secondary | ICD-10-CM | POA: Diagnosis not present

## 2021-04-12 DIAGNOSIS — H6123 Impacted cerumen, bilateral: Secondary | ICD-10-CM | POA: Diagnosis not present

## 2021-05-23 DIAGNOSIS — H31092 Other chorioretinal scars, left eye: Secondary | ICD-10-CM | POA: Diagnosis not present

## 2021-05-23 DIAGNOSIS — H43393 Other vitreous opacities, bilateral: Secondary | ICD-10-CM | POA: Diagnosis not present

## 2021-05-23 DIAGNOSIS — H43823 Vitreomacular adhesion, bilateral: Secondary | ICD-10-CM | POA: Diagnosis not present

## 2021-05-23 DIAGNOSIS — Z961 Presence of intraocular lens: Secondary | ICD-10-CM | POA: Diagnosis not present

## 2021-05-23 DIAGNOSIS — H2511 Age-related nuclear cataract, right eye: Secondary | ICD-10-CM | POA: Diagnosis not present

## 2021-05-23 DIAGNOSIS — L719 Rosacea, unspecified: Secondary | ICD-10-CM | POA: Diagnosis not present

## 2021-05-23 DIAGNOSIS — H0102B Squamous blepharitis left eye, upper and lower eyelids: Secondary | ICD-10-CM | POA: Diagnosis not present

## 2021-05-23 DIAGNOSIS — H0102A Squamous blepharitis right eye, upper and lower eyelids: Secondary | ICD-10-CM | POA: Diagnosis not present

## 2021-05-30 DIAGNOSIS — Z23 Encounter for immunization: Secondary | ICD-10-CM | POA: Diagnosis not present

## 2021-05-30 DIAGNOSIS — Z0001 Encounter for general adult medical examination with abnormal findings: Secondary | ICD-10-CM | POA: Diagnosis not present

## 2021-05-30 DIAGNOSIS — E782 Mixed hyperlipidemia: Secondary | ICD-10-CM | POA: Diagnosis not present

## 2021-07-19 DIAGNOSIS — I10 Essential (primary) hypertension: Secondary | ICD-10-CM | POA: Diagnosis not present

## 2021-07-19 DIAGNOSIS — R600 Localized edema: Secondary | ICD-10-CM | POA: Diagnosis not present

## 2021-08-24 DIAGNOSIS — J042 Acute laryngotracheitis: Secondary | ICD-10-CM | POA: Diagnosis not present

## 2021-08-24 DIAGNOSIS — J329 Chronic sinusitis, unspecified: Secondary | ICD-10-CM | POA: Diagnosis not present

## 2021-08-24 DIAGNOSIS — I10 Essential (primary) hypertension: Secondary | ICD-10-CM | POA: Diagnosis not present

## 2021-09-29 ENCOUNTER — Ambulatory Visit
Admission: EM | Admit: 2021-09-29 | Discharge: 2021-09-29 | Disposition: A | Discharge status: Home / Self Care | Payer: Medicare Other

## 2021-09-29 DIAGNOSIS — J019 Acute sinusitis, unspecified: Secondary | ICD-10-CM | POA: Diagnosis not present

## 2021-09-29 MED ORDER — PROMETHAZINE-DM 6.25-15 MG/5ML PO SYRP: 5.0000 mL | ORAL_SOLUTION | Freq: Four times a day (QID) | ORAL | 0 refills | Status: DC | PRN

## 2021-09-29 MED ORDER — AMOXICILLIN-POT CLAVULANATE 875-125 MG PO TABS: 1.0000 | ORAL_TABLET | Freq: Two times a day (BID) | ORAL | 0 refills | Status: DC

## 2021-09-29 NOTE — ED Triage Notes (Signed)
Pt presents with c/o nasal congestion and cough, pt states he was treated for sinus infection at beginning of month felt a little better then worsened

## 2021-10-03 DIAGNOSIS — J019 Acute sinusitis, unspecified: Secondary | ICD-10-CM | POA: Diagnosis not present

## 2021-10-16 DIAGNOSIS — H903 Sensorineural hearing loss, bilateral: Secondary | ICD-10-CM | POA: Diagnosis not present

## 2021-10-16 DIAGNOSIS — H6123 Impacted cerumen, bilateral: Secondary | ICD-10-CM | POA: Diagnosis not present

## 2021-11-06 DIAGNOSIS — Z85820 Personal history of malignant melanoma of skin: Secondary | ICD-10-CM | POA: Diagnosis not present

## 2021-11-06 DIAGNOSIS — Z08 Encounter for follow-up examination after completed treatment for malignant neoplasm: Secondary | ICD-10-CM | POA: Diagnosis not present

## 2021-11-06 DIAGNOSIS — C44229 Squamous cell carcinoma of skin of left ear and external auricular canal: Secondary | ICD-10-CM | POA: Diagnosis not present

## 2021-11-06 DIAGNOSIS — B351 Tinea unguium: Secondary | ICD-10-CM | POA: Diagnosis not present

## 2021-11-06 DIAGNOSIS — L570 Actinic keratosis: Secondary | ICD-10-CM | POA: Diagnosis not present

## 2021-11-06 DIAGNOSIS — D225 Melanocytic nevi of trunk: Secondary | ICD-10-CM | POA: Diagnosis not present

## 2021-11-06 DIAGNOSIS — X32XXXD Exposure to sunlight, subsequent encounter: Secondary | ICD-10-CM | POA: Diagnosis not present

## 2021-11-06 DIAGNOSIS — Z1283 Encounter for screening for malignant neoplasm of skin: Secondary | ICD-10-CM | POA: Diagnosis not present

## 2021-12-18 DIAGNOSIS — Z08 Encounter for follow-up examination after completed treatment for malignant neoplasm: Secondary | ICD-10-CM | POA: Diagnosis not present

## 2021-12-18 DIAGNOSIS — X32XXXD Exposure to sunlight, subsequent encounter: Secondary | ICD-10-CM | POA: Diagnosis not present

## 2021-12-18 DIAGNOSIS — L570 Actinic keratosis: Secondary | ICD-10-CM | POA: Diagnosis not present

## 2021-12-18 DIAGNOSIS — L218 Other seborrheic dermatitis: Secondary | ICD-10-CM | POA: Diagnosis not present

## 2021-12-18 DIAGNOSIS — Z85828 Personal history of other malignant neoplasm of skin: Secondary | ICD-10-CM | POA: Diagnosis not present

## 2022-01-08 DIAGNOSIS — R799 Abnormal finding of blood chemistry, unspecified: Secondary | ICD-10-CM | POA: Diagnosis not present

## 2022-01-08 DIAGNOSIS — Z0001 Encounter for general adult medical examination with abnormal findings: Secondary | ICD-10-CM | POA: Diagnosis not present

## 2022-01-08 DIAGNOSIS — Z0000 Encounter for general adult medical examination without abnormal findings: Secondary | ICD-10-CM | POA: Diagnosis not present

## 2022-04-26 DIAGNOSIS — D485 Neoplasm of uncertain behavior of skin: Secondary | ICD-10-CM | POA: Diagnosis not present

## 2022-04-26 DIAGNOSIS — L570 Actinic keratosis: Secondary | ICD-10-CM | POA: Diagnosis not present

## 2022-04-26 DIAGNOSIS — Z85828 Personal history of other malignant neoplasm of skin: Secondary | ICD-10-CM | POA: Diagnosis not present

## 2022-04-26 DIAGNOSIS — D2271 Melanocytic nevi of right lower limb, including hip: Secondary | ICD-10-CM | POA: Diagnosis not present

## 2022-04-26 DIAGNOSIS — Z08 Encounter for follow-up examination after completed treatment for malignant neoplasm: Secondary | ICD-10-CM | POA: Diagnosis not present

## 2022-04-26 DIAGNOSIS — Z1283 Encounter for screening for malignant neoplasm of skin: Secondary | ICD-10-CM | POA: Diagnosis not present

## 2022-04-26 DIAGNOSIS — D225 Melanocytic nevi of trunk: Secondary | ICD-10-CM | POA: Diagnosis not present

## 2022-04-26 DIAGNOSIS — X32XXXD Exposure to sunlight, subsequent encounter: Secondary | ICD-10-CM | POA: Diagnosis not present

## 2022-06-14 DIAGNOSIS — H6123 Impacted cerumen, bilateral: Secondary | ICD-10-CM | POA: Diagnosis not present

## 2022-06-15 ENCOUNTER — Ambulatory Visit: Payer: Medicare Other | Admitting: Family Medicine

## 2022-06-26 DIAGNOSIS — Z760 Encounter for issue of repeat prescription: Secondary | ICD-10-CM | POA: Diagnosis not present

## 2022-06-26 DIAGNOSIS — I10 Essential (primary) hypertension: Secondary | ICD-10-CM | POA: Diagnosis not present

## 2022-08-01 ENCOUNTER — Encounter: Payer: Self-pay | Admitting: Family Medicine

## 2022-08-01 ENCOUNTER — Ambulatory Visit (INDEPENDENT_AMBULATORY_CARE_PROVIDER_SITE_OTHER): Payer: Medicare Other | Admitting: Family Medicine

## 2022-08-01 VITALS — BP 120/70 | HR 82 | Temp 97.7°F | Ht 74.0 in | Wt 246.0 lb

## 2022-08-01 DIAGNOSIS — K219 Gastro-esophageal reflux disease without esophagitis: Secondary | ICD-10-CM | POA: Insufficient documentation

## 2022-08-01 DIAGNOSIS — N400 Enlarged prostate without lower urinary tract symptoms: Secondary | ICD-10-CM | POA: Diagnosis not present

## 2022-08-01 DIAGNOSIS — I10 Essential (primary) hypertension: Secondary | ICD-10-CM

## 2022-08-01 DIAGNOSIS — L918 Other hypertrophic disorders of the skin: Secondary | ICD-10-CM | POA: Insufficient documentation

## 2022-08-01 DIAGNOSIS — N189 Chronic kidney disease, unspecified: Secondary | ICD-10-CM

## 2022-08-01 MED ORDER — PANTOPRAZOLE SODIUM 40 MG PO TBEC: 40.0000 mg | DELAYED_RELEASE_TABLET | Freq: Every day | ORAL | 1 refills | Status: DC

## 2022-08-01 MED ORDER — LOSARTAN POTASSIUM 50 MG PO TABS: 50.0000 mg | ORAL_TABLET | Freq: Every day | ORAL | 3 refills | Status: DC

## 2022-08-01 MED ORDER — TAMSULOSIN HCL 0.4 MG PO CAPS: 0.4000 mg | ORAL_CAPSULE | Freq: Every day | ORAL | 3 refills | Status: DC

## 2022-08-01 MED ORDER — HYDROCHLOROTHIAZIDE 25 MG PO TABS: 25.0000 mg | ORAL_TABLET | Freq: Every day | ORAL | 1 refills | Status: DC

## 2022-08-01 NOTE — Assessment & Plan Note (Signed)
Removed today  

## 2022-08-01 NOTE — Assessment & Plan Note (Signed)
Patient to bring in lab results.

## 2022-08-01 NOTE — Patient Instructions (Signed)
Medications refilled.  Get me a copy of your most recent labs.  Follow up in 6 months.

## 2022-08-01 NOTE — Assessment & Plan Note (Signed)
Starting protonix.

## 2022-08-01 NOTE — Progress Notes (Addendum)
Subjective:  Patient ID: Marcus [name], male    DOB: 1950-08-18  Age: 72 y.o. MRN: [id number]  CC: Chief Complaint  Patient presents with   Establish Care    Refill medications    HPI:  72 year old male with a PMH of HTN, BPH, Pulmonary nodule (history of asbestos exposure) and GERD presents to establish care.   HTN is stable on HCTZ and Losartan. Patient states that he will bring a copy of his most recent labs.  BPH is stable currently on Flomax.  Patient states that he has issues from GERD at least once a month despite use of Omeprazole. He states that he would like to try something else.  He has a history of CKD. Has seen nephrology. Need labs records.   Also, he has a skin tag on his chin below his lower lip that is troublesome. He would like me to remove it.   Social Hx   Social History   Socioeconomic History   Marital status: Married    Spouse name: Not on file   Number of children: Not on file   Years of education: Not on file   Highest education level: Not on file  Occupational History   Not on file  Tobacco Use   Smoking status: Never   Smokeless tobacco: Never  Vaping Use   Vaping Use: Never used  Substance and Sexual Activity   Alcohol use: No   Drug use: No   Sexual activity: Not on file  Other Topics Concern   Not on file  Social History Narrative   Not on file   Social Determinants of Health   Financial Resource Strain: Not on file  Food Insecurity: Not on file  Transportation Needs: Not on file  Physical Activity: Not on file  Stress: Not on file  Social Connections: Not on file    Review of Systems Per HPI  Objective:  BP 120/70   Pulse 82   Temp 97.7 F (36.5 C)   Ht  (1.88 m)   Wt 246 lb (111.6 kg)   SpO2 97%   BMI 31.58 kg/m      08/01/2022   10:42 AM 09/29/2021    7:05 PM 08/21/2017    1:10 PM  BP/Weight  Systolic BP 120 138 124  Diastolic BP 70 92 77  Wt. (Lbs) 246    BMI 31.58 kg/m2      Physical  Exam Vitals and nursing note reviewed.  Constitutional:      General: He is not in acute distress.    Appearance: Normal appearance.  HENT:     Head: Normocephalic and atraumatic.      Comments: Small skin tag at the labelled location. Eyes:     General:        Right eye: No discharge.        Left eye: No discharge.     Conjunctiva/sclera: Conjunctivae normal.  Cardiovascular:     Rate and Rhythm: Normal rate and regular rhythm.  Pulmonary:     Effort: Pulmonary effort is normal.     Breath sounds: Normal breath sounds. No wheezing, rhonchi or rales.  Neurological:     Mental Status: He is alert.  Psychiatric:        Mood and Affect: Mood normal.        Behavior: Behavior normal.   Removal of skin tag Area cleansed with alcohol. Anesthetized with 1% lidocaine with Epi. Hemostat used to clamp the  base of the skin tag. Scissors used to remove skin tag. Pressure applied and hemostasis achieved. Patient tolerated the procedure well. No complications.  Assessment & Plan:   Problem List Items Addressed This Visit       Cardiovascular and Mediastinum   Hypertension - Primary    Stable/at goal. Continued HCTZ and Losartan. Refilled today.       Relevant Medications   hydrochlorothiazide (HYDRODIURIL) 25 MG tablet   losartan (COZAAR) 50 MG tablet     Digestive   GERD (gastroesophageal reflux disease)    Starting protonix.       Relevant Medications   pantoprazole (PROTONIX) 40 MG tablet     Musculoskeletal and Integument   Skin tag    Removed today.        Genitourinary   CKD (chronic kidney disease)    Patient to bring in lab results.       BPH (benign prostatic hyperplasia)    Continuing flomax. Refilled today.      Relevant Medications   tamsulosin (FLOMAX) 0.4 MG CAPS capsule    Meds ordered this encounter  Medications   hydrochlorothiazide (HYDRODIURIL) 25 MG tablet    Sig: Take 1 tablet (25 mg total) by mouth daily.    Dispense:  90 tablet     Refill:  1   losartan (COZAAR) 50 MG tablet    Sig: Take 1 tablet (50 mg total) by mouth daily.    Dispense:  90 tablet    Refill:  3   tamsulosin (FLOMAX) 0.4 MG CAPS capsule    Sig: Take 1 capsule (0.4 mg total) by mouth daily.    Dispense:  90 capsule    Refill:  3   pantoprazole (PROTONIX) 40 MG tablet    Sig: Take 1 tablet (40 mg total) by mouth daily.    Dispense:  90 tablet    Refill:  1    Follow-up:  Return in about 6 months (around 01/31/2023).  Everlene Other DO [facility]

## 2022-08-01 NOTE — Assessment & Plan Note (Signed)
Continuing flomax. Refilled today.

## 2022-08-01 NOTE — Assessment & Plan Note (Signed)
Stable/at goal. Continued HCTZ and Losartan. Refilled today.

## 2022-09-07 ENCOUNTER — Emergency Department (HOSPITAL_COMMUNITY): Payer: Medicare Other

## 2022-09-07 ENCOUNTER — Emergency Department (HOSPITAL_COMMUNITY)
Admission: EM | Admit: 2022-09-07 | Discharge: 2022-09-07 | Disposition: A | Discharge status: Home / Self Care | Payer: Medicare Other | Attending: Emergency Medicine | Admitting: Emergency Medicine

## 2022-09-07 ENCOUNTER — Other Ambulatory Visit: Payer: Self-pay

## 2022-09-07 DIAGNOSIS — R0789 Other chest pain: Secondary | ICD-10-CM | POA: Insufficient documentation

## 2022-09-07 DIAGNOSIS — R1012 Left upper quadrant pain: Secondary | ICD-10-CM | POA: Diagnosis not present

## 2022-09-07 DIAGNOSIS — W1839XA Other fall on same level, initial encounter: Secondary | ICD-10-CM | POA: Diagnosis not present

## 2022-09-07 DIAGNOSIS — R0781 Pleurodynia: Secondary | ICD-10-CM | POA: Diagnosis not present

## 2022-09-07 MED ORDER — CYCLOBENZAPRINE HCL 10 MG PO TABS
5.0000 mg | ORAL_TABLET | Freq: Once | ORAL | Status: AC
  Administered 2022-09-07: 5 mg via ORAL
  Filled 2022-09-07: qty 1

## 2022-09-07 MED ORDER — CYCLOBENZAPRINE HCL 10 MG PO TABS: 10.0000 mg | ORAL_TABLET | Freq: Two times a day (BID) | ORAL | 0 refills | Status: DC | PRN

## 2022-09-07 NOTE — ED Provider Notes (Signed)
[place] EMERGENCY DEPARTMENT AT [facility] Provider Note   CSN: [id number] Arrival date & time: 09/07/22  1047     History  Chief Complaint  Patient presents with   Rib Injury    Marcus [name] is a 72 y.o. male.  HPI 72 year old male presents today complaining of some pain over the left lateral rib area.  He states that he was bending over several days ago to pull something out of a trash can when he felt a popping sensation and has had pain in that area since that time.  He denies any shortness of breath, fever, chills, direct trauma.  He was seen at an urgent care today and told that his x-Marcus [name] was abnormal he needed to come to the ER for further evaluation    Home Medications Prior to Admission medications   Medication Sig Start Date End Date Taking? Authorizing Provider  cyclobenzaprine (FLEXERIL) 10 MG tablet Take 1 tablet (10 mg total) by mouth 2 (two) times daily as needed for muscle spasms. 09/07/22  Yes Margarita Grizzle, MD  hydrochlorothiazide (HYDRODIURIL) 25 MG tablet Take 1 tablet (25 mg total) by mouth daily. 08/01/22   Tommie Sams, DO  losartan (COZAAR) 50 MG tablet Take 1 tablet (50 mg total) by mouth daily. 08/01/22   Tommie Sams, DO  Multiple Vitamin (MULTIVITAMIN WITH MINERALS) TABS tablet Take 1 tablet by mouth daily.    [provider]  pantoprazole (PROTONIX) 40 MG tablet Take 1 tablet (40 mg total) by mouth daily. 08/01/22   Tommie Sams, DO  tamsulosin (FLOMAX) 0.4 MG CAPS capsule Take 1 capsule (0.4 mg total) by mouth daily. 08/01/22   Tommie Sams, DO      Allergies    Patient has no known allergies.    Review of Systems   Review of Systems  Physical Exam Updated Vital Signs BP 134/77 (BP Location: Left Arm)   Pulse 98   Temp 97.8 F (36.6 C) (Oral)   Resp (!) 22   SpO2 98%  Physical Exam Vitals and nursing note reviewed.  Constitutional:      Appearance: He is well-developed.  HENT:     Head: Normocephalic and  atraumatic.     Right Ear: External ear normal.     Left Ear: External ear normal.     Nose: Nose normal.  Eyes:     Extraocular Movements: Extraocular movements intact.  Neck:     Trachea: No tracheal deviation.  Cardiovascular:     Rate and Rhythm: Normal rate and regular rhythm.     Comments: Some point tenderness over left lateral lower rib approximately 7-8 and anterior axillary line Pulmonary:     Effort: Pulmonary effort is normal.  Musculoskeletal:        General: Normal range of motion.  Skin:    General: Skin is warm and dry.  Neurological:     Mental Status: He is alert and oriented to person, place, and time.  Psychiatric:        Mood and Affect: Mood normal.        Behavior: Behavior normal.     ED Results / Procedures / Treatments   Labs (all labs ordered are listed, but only abnormal results are displayed) Labs Reviewed - No data to display  EKG None  Radiology DG Ribs Unilateral W/Chest Left  Result Date: 09/07/2022 CLINICAL DATA:  Left rib pain after injury on Monday. EXAM: LEFT RIBS AND CHEST - 3+  VIEW COMPARISON:  None Available. FINDINGS: No fracture or other bone lesions are seen involving the ribs. There is no evidence of pneumothorax or pleural effusion. Both lungs are clear. Heart size and mediastinal contours are within normal limits. IMPRESSION: Negative. Electronically Signed   By: Obie Dredge M.D.   On: 09/07/2022 12:34    Procedures Procedures    Medications Ordered in ED Medications  cyclobenzaprine (FLEXERIL) tablet 5 mg (5 mg Oral Given 09/07/22 1147)    ED Course/ Medical Decision Making/ A&P Clinical Course as of 09/07/22 1320  Fri Sep 07, 2022  1249 Left rib detail with chest x-Anedra Penafiel reviewed interpreted and within normal limits and radiologist interpretation concurs [DR]    Clinical Course User Index [DR] Margarita Grizzle, MD                             Medical Decision Making Amount and/or Complexity of Data  Reviewed Radiology: ordered.  Risk Prescription drug management.   72 year old male with a traumatic injury to his left chest wall.  This occurred when leaning over.  He has some mild point tenderness in the left lateral rib cage.  X-rays were obtained and showed no evidence of fracture and no evidence of underlying injury.  He did not fall or have injury to his abdomen.  There is no abdominal tenderness. Differential diagnosis includes but is not limited to skeletal muscle strain, bony injury, other etiologies of chest pain including cardiac disease.  I feel that this is very reproducible and occurred with a sudden onset consistent with muscular strain or rib injury.  No obvious fracture displacement no underlying injury to the lung on imaging.  Patient appears stable for discharge.  Advised regarding conservative therapy, pain medication, hot and cold therapy and return precautions and voices understanding        Final Clinical Impression(s) / ED Diagnoses Final diagnoses:  Chest wall pain    Rx / DC Orders ED Discharge Orders          Ordered    cyclobenzaprine (FLEXERIL) 10 MG tablet  2 times daily PRN        09/07/22 1253              Margarita Grizzle, MD 09/07/22 1320

## 2022-09-07 NOTE — ED Triage Notes (Addendum)
Pt sent from urgent care for eval of left rib pain after "getting a fight with a trash can" on Monday.  Had x-rays at urgent care but was told they didn't see a fracture but it "looked like his ribs had shifted" and he may need a CT and MRI  pt states pain is 2/10 on ibuprofen but 8/10 when the ibuprofen wears off.

## 2022-10-20 DIAGNOSIS — M545 Low back pain, unspecified: Secondary | ICD-10-CM | POA: Diagnosis not present

## 2022-11-13 ENCOUNTER — Telehealth: Payer: Self-pay

## 2022-11-13 DIAGNOSIS — R7989 Other specified abnormal findings of blood chemistry: Secondary | ICD-10-CM

## 2022-11-13 DIAGNOSIS — N189 Chronic kidney disease, unspecified: Secondary | ICD-10-CM

## 2022-11-13 DIAGNOSIS — I10 Essential (primary) hypertension: Secondary | ICD-10-CM

## 2022-11-13 NOTE — Telephone Encounter (Signed)
Pt has upcoming appt and wants blood work ordered before his appt   Pt Marcus [name] [phone]

## 2022-11-15 NOTE — Telephone Encounter (Signed)
Labs ordered and patient notified via MyChart

## 2022-11-16 DIAGNOSIS — Z961 Presence of intraocular lens: Secondary | ICD-10-CM | POA: Diagnosis not present

## 2022-11-16 DIAGNOSIS — L719 Rosacea, unspecified: Secondary | ICD-10-CM | POA: Diagnosis not present

## 2022-11-16 DIAGNOSIS — H0102B Squamous blepharitis left eye, upper and lower eyelids: Secondary | ICD-10-CM | POA: Diagnosis not present

## 2022-11-16 DIAGNOSIS — H2511 Age-related nuclear cataract, right eye: Secondary | ICD-10-CM | POA: Diagnosis not present

## 2022-11-16 DIAGNOSIS — H31092 Other chorioretinal scars, left eye: Secondary | ICD-10-CM | POA: Diagnosis not present

## 2022-11-16 DIAGNOSIS — H43393 Other vitreous opacities, bilateral: Secondary | ICD-10-CM | POA: Diagnosis not present

## 2022-11-16 DIAGNOSIS — H0102A Squamous blepharitis right eye, upper and lower eyelids: Secondary | ICD-10-CM | POA: Diagnosis not present

## 2022-11-16 DIAGNOSIS — H43823 Vitreomacular adhesion, bilateral: Secondary | ICD-10-CM | POA: Diagnosis not present

## 2022-11-24 DIAGNOSIS — J00 Acute nasopharyngitis [common cold]: Secondary | ICD-10-CM | POA: Diagnosis not present

## 2022-11-24 DIAGNOSIS — H6121 Impacted cerumen, right ear: Secondary | ICD-10-CM | POA: Diagnosis not present

## 2022-11-24 DIAGNOSIS — R030 Elevated blood-pressure reading, without diagnosis of hypertension: Secondary | ICD-10-CM | POA: Diagnosis not present

## 2022-11-26 DIAGNOSIS — Z08 Encounter for follow-up examination after completed treatment for malignant neoplasm: Secondary | ICD-10-CM | POA: Diagnosis not present

## 2022-11-26 DIAGNOSIS — L820 Inflamed seborrheic keratosis: Secondary | ICD-10-CM | POA: Diagnosis not present

## 2022-11-26 DIAGNOSIS — D225 Melanocytic nevi of trunk: Secondary | ICD-10-CM | POA: Diagnosis not present

## 2022-11-26 DIAGNOSIS — Z1283 Encounter for screening for malignant neoplasm of skin: Secondary | ICD-10-CM | POA: Diagnosis not present

## 2022-11-26 DIAGNOSIS — I10 Essential (primary) hypertension: Secondary | ICD-10-CM | POA: Diagnosis not present

## 2022-11-26 DIAGNOSIS — Z85820 Personal history of malignant melanoma of skin: Secondary | ICD-10-CM | POA: Diagnosis not present

## 2022-11-26 DIAGNOSIS — N189 Chronic kidney disease, unspecified: Secondary | ICD-10-CM | POA: Diagnosis not present

## 2022-11-27 LAB — CBC WITH DIFFERENTIAL/PLATELET
Immature Granulocytes: 1 %
Neutrophils Absolute: 11.3 10*3/uL — ABNORMAL HIGH (ref 1.4–7.0)
Platelets: 194 10*3/uL (ref 150–450)
RBC: 5.62 x10E6/uL (ref 4.14–5.80)
WBC: 15.1 10*3/uL — ABNORMAL HIGH (ref 3.4–10.8)

## 2022-11-27 LAB — COMPREHENSIVE METABOLIC PANEL
CO2: 24 mmol/L (ref 20–29)
Potassium: 3.5 mmol/L (ref 3.5–5.2)

## 2022-11-27 LAB — LIPID PANEL: LDL Chol Calc (NIH): 54 mg/dL (ref 0–99)

## 2022-11-27 LAB — MICROALBUMIN / CREATININE URINE RATIO
Creatinine, Urine: 121.3 mg/dL
Microalbumin, Urine: <3 ug/mL

## 2022-12-03 NOTE — Addendum Note (Signed)
Addended by: Margaretha Sheffield on: 12/03/2022 03:10 PM   Modules accepted: Orders

## 2022-12-11 DIAGNOSIS — H6121 Impacted cerumen, right ear: Secondary | ICD-10-CM | POA: Diagnosis not present

## 2023-01-26 ENCOUNTER — Other Ambulatory Visit: Payer: Self-pay | Admitting: Family Medicine

## 2023-01-29 ENCOUNTER — Ambulatory Visit: Payer: Medicare Other | Admitting: Family Medicine

## 2023-01-29 VITALS — BP 120/77 | HR 72 | Temp 98.1°F | Ht 74.0 in | Wt 239.2 lb

## 2023-01-29 DIAGNOSIS — Z23 Encounter for immunization: Secondary | ICD-10-CM

## 2023-01-29 DIAGNOSIS — I10 Essential (primary) hypertension: Secondary | ICD-10-CM

## 2023-01-29 DIAGNOSIS — N401 Enlarged prostate with lower urinary tract symptoms: Secondary | ICD-10-CM

## 2023-01-29 DIAGNOSIS — K219 Gastro-esophageal reflux disease without esophagitis: Secondary | ICD-10-CM

## 2023-01-29 DIAGNOSIS — N1831 Chronic kidney disease, stage 3a: Secondary | ICD-10-CM | POA: Diagnosis not present

## 2023-01-29 DIAGNOSIS — R351 Nocturia: Secondary | ICD-10-CM

## 2023-01-29 MED ORDER — TAMSULOSIN HCL 0.4 MG PO CAPS: 0.8000 mg | ORAL_CAPSULE | Freq: Every day | ORAL | 3 refills | Status: DC

## 2023-01-29 NOTE — Patient Instructions (Signed)
Lab today.  I increased the Flomax.  Follow up in 6 months.

## 2023-01-29 NOTE — Assessment & Plan Note (Signed)
Recommended healthy diet.  Protonix as needed.

## 2023-01-29 NOTE — Progress Notes (Signed)
Subjective:  Patient ID: Marcus [name], male    DOB: 03-08-1951  Age: 72 y.o. MRN: [id number]  CC: Follow-up   HPI:  72 year old male presents for follow-up.  Hypertension well-controlled on losartan and HCTZ.  Patient reports that his GERD is overall stable.  He has some symptoms intermittently with dietary indiscretion.  Compliant with Protonix.  Patient reports that he is getting up to urinate several times during the night.  Currently on Flomax 0.4 mg daily.  Will discuss increase today.  Patient would like to discuss his lab work.  Has known CKD.  Creatinine mildly elevated.  GFR slightly down at 59.  Patient was also noted to have leukocytosis.  Needs repeat CBC.  Patient Active Problem List   Diagnosis Date Noted   Hypertension 08/01/2022   BPH (benign prostatic hyperplasia) 08/01/2022   GERD (gastroesophageal reflux disease) 08/01/2022   Skin tag 08/01/2022   CKD (chronic kidney disease) 08/01/2022    Social Hx   Social History   Socioeconomic History   Marital status: Married    Spouse name: Not on file   Number of children: Not on file   Years of education: Not on file   Highest education level: Not on file  Occupational History   Not on file  Tobacco Use   Smoking status: Never   Smokeless tobacco: Never  Vaping Use   Vaping status: Never Used  Substance and Sexual Activity   Alcohol use: No   Drug use: No   Sexual activity: Not on file  Other Topics Concern   Not on file  Social History Narrative   Not on file   Social Determinants of Health   Financial Resource Strain: Not on file  Food Insecurity: Not on file  Transportation Needs: Not on file  Physical Activity: Not on file  Stress: Not on file  Social Connections: Unknown (08/20/2021)   Received from [facility]   Social Network    Social Network: Not on file    Review of Systems Per HPI  Objective:  BP 120/77   Pulse 72   Temp 98.1 F (36.7 C)   Ht 6\' 2"   (1.88 m)   Wt 239 lb 3.2 oz (108.5 kg)   SpO2 98%   BMI 30.71 kg/m      01/29/2023    8:31 AM 09/07/2022   11:27 AM 09/07/2022   11:25 AM  BP/Weight  Systolic BP 120 134 134  Diastolic BP 77 77 77  Wt. (Lbs) 239.2    BMI 30.71 kg/m2      Physical Exam Vitals and nursing note reviewed.  Constitutional:      General: He is not in acute distress.    Appearance: Normal appearance.  HENT:     Head: Normocephalic and atraumatic.  Eyes:     General:        Right eye: No discharge.        Left eye: No discharge.     Conjunctiva/sclera: Conjunctivae normal.  Cardiovascular:     Rate and Rhythm: Normal rate and regular rhythm.  Pulmonary:     Effort: Pulmonary effort is normal.     Breath sounds: Normal breath sounds. No wheezing, rhonchi or rales.  Neurological:     Mental Status: He is alert.  Psychiatric:        Mood and Affect: Mood normal.        Behavior: Behavior normal.     Lab Results  Component  Value Date   WBC 15.1 (H) 11/26/2022   HGB 16.6 11/26/2022   HCT 49.5 11/26/2022   PLT 194 11/26/2022   GLUCOSE 88 11/26/2022   CHOL 118 11/26/2022   TRIG 125 11/26/2022   HDL 42 11/26/2022   LDLCALC 54 11/26/2022   ALT 20 11/26/2022   AST 20 11/26/2022   NA 140 11/26/2022   K 3.5 11/26/2022   CL 101 11/26/2022   CREATININE 1.28 (H) 11/26/2022   BUN 22 11/26/2022   CO2 24 11/26/2022     Assessment & Plan:   Problem List Items Addressed This Visit       Cardiovascular and Mediastinum   Hypertension - Primary    Stable.  Continue HCTZ and losartan.        Digestive   GERD (gastroesophageal reflux disease)    Recommended healthy diet.  Protonix as needed.        Genitourinary   CKD (chronic kidney disease)    Creatinine stable.      BPH (benign prostatic hyperplasia)    Uncontrolled.  Increasing Flomax.      Relevant Medications   tamsulosin (FLOMAX) 0.4 MG CAPS capsule   Other Visit Diagnoses     Immunization due       Relevant Orders    Flu Vaccine Trivalent High Dose (Fluad) (Completed)       Meds ordered this encounter  Medications   tamsulosin (FLOMAX) 0.4 MG CAPS capsule    Sig: Take 2 capsules (0.8 mg total) by mouth daily.    Dispense:  180 capsule    Refill:  3    Follow-up:  Return in about 6 months (around 07/30/2023) for HTN follow up, Follow up Chronic medical issues.  Everlene Other DO [facility]

## 2023-01-29 NOTE — Assessment & Plan Note (Addendum)
Creatinine stable.

## 2023-01-29 NOTE — Assessment & Plan Note (Signed)
Uncontrolled.  Increasing Flomax.

## 2023-01-29 NOTE — Assessment & Plan Note (Signed)
Stable.  Continue HCTZ and losartan.

## 2023-01-30 ENCOUNTER — Telehealth: Payer: Self-pay | Admitting: Family Medicine

## 2023-01-30 NOTE — Telephone Encounter (Signed)
Patient is requesting labs before his appointment in April forkidney function and check jis white blood count

## 2023-01-31 ENCOUNTER — Ambulatory Visit: Payer: Medicare Other | Admitting: Family Medicine

## 2023-02-05 ENCOUNTER — Other Ambulatory Visit: Payer: Self-pay

## 2023-02-05 DIAGNOSIS — N1831 Chronic kidney disease, stage 3a: Secondary | ICD-10-CM

## 2023-02-05 DIAGNOSIS — I10 Essential (primary) hypertension: Secondary | ICD-10-CM

## 2023-02-05 DIAGNOSIS — Z13220 Encounter for screening for lipoid disorders: Secondary | ICD-10-CM

## 2023-02-05 DIAGNOSIS — D582 Other hemoglobinopathies: Secondary | ICD-10-CM

## 2023-02-05 NOTE — Telephone Encounter (Signed)
Cook, Jayce G, DO     CBC, CMP, Lipid, Urine Microalbumin.

## 2023-02-11 DIAGNOSIS — N1831 Chronic kidney disease, stage 3a: Secondary | ICD-10-CM | POA: Diagnosis not present

## 2023-02-11 DIAGNOSIS — Z13220 Encounter for screening for lipoid disorders: Secondary | ICD-10-CM | POA: Diagnosis not present

## 2023-02-11 DIAGNOSIS — I10 Essential (primary) hypertension: Secondary | ICD-10-CM | POA: Diagnosis not present

## 2023-02-12 LAB — COMPREHENSIVE METABOLIC PANEL
ALT: 17 [IU]/L (ref 0–44)
AST: 22 [IU]/L (ref 0–40)
Albumin: 4.1 g/dL (ref 3.8–4.8)
Alkaline Phosphatase: 108 [IU]/L (ref 44–121)
BUN/Creatinine Ratio: 15 (ref 10–24)
BUN: 18 mg/dL (ref 8–27)
Bilirubin Total: 0.6 mg/dL (ref 0.0–1.2)
CO2: 19 mmol/L — ABNORMAL LOW (ref 20–29)
Calcium: 9.9 mg/dL (ref 8.6–10.2)
Chloride: 103 mmol/L (ref 96–106)
Creatinine, Ser: 1.21 mg/dL (ref 0.76–1.27)
Globulin, Total: 2.3 g/dL (ref 1.5–4.5)
Glucose: 111 mg/dL — ABNORMAL HIGH (ref 70–99)
Potassium: 3.8 mmol/L (ref 3.5–5.2)
Sodium: 141 mmol/L (ref 134–144)
Total Protein: 6.4 g/dL (ref 6.0–8.5)
eGFR: 64 mL/min/{1.73_m2} (ref >59)

## 2023-02-12 LAB — MICROALBUMIN / CREATININE URINE RATIO
Creatinine, Urine: 139.5 mg/dL
Microalb/Creat Ratio: <2 mg/g{creat} (ref 0–29)
Microalbumin, Urine: <3 ug/mL

## 2023-02-12 LAB — LIPID PANEL
Chol/HDL Ratio: 3.4 ratio (ref 0.0–5.0)
Cholesterol, Total: 145 mg/dL (ref 100–199)
HDL: 43 mg/dL (ref >39)
LDL Chol Calc (NIH): 61 mg/dL (ref 0–99)
Triglycerides: 258 mg/dL — ABNORMAL HIGH (ref 0–149)
VLDL Cholesterol Cal: 41 mg/dL — ABNORMAL HIGH (ref 5–40)

## 2023-02-12 LAB — CBC WITH DIFFERENTIAL/PLATELET
Basophils Absolute: 0.1 10*3/uL (ref 0.0–0.2)
Basos: 1 %
EOS (ABSOLUTE): 0.2 10*3/uL (ref 0.0–0.4)
Eos: 2 %
Hematocrit: 53.2 % — ABNORMAL HIGH (ref 37.5–51.0)
Hemoglobin: 18.1 g/dL — ABNORMAL HIGH (ref 13.0–17.7)
Immature Grans (Abs): 0.1 10*3/uL (ref 0.0–0.1)
Immature Granulocytes: 1 %
Lymphocytes Absolute: 1.9 10*3/uL (ref 0.7–3.1)
Lymphs: 21 %
MCH: 30.9 pg (ref 26.6–33.0)
MCHC: 34 g/dL (ref 31.5–35.7)
MCV: 91 fL (ref 79–97)
Monocytes Absolute: 1 10*3/uL — ABNORMAL HIGH (ref 0.1–0.9)
Monocytes: 11 %
Neutrophils Absolute: 5.9 10*3/uL (ref 1.4–7.0)
Neutrophils: 64 %
Platelets: 207 10*3/uL (ref 150–450)
RBC: 5.85 x10E6/uL — ABNORMAL HIGH (ref 4.14–5.80)
RDW: 13.6 % (ref 11.6–15.4)
WBC: 9.1 10*3/uL (ref 3.4–10.8)

## 2023-02-13 NOTE — Addendum Note (Signed)
Addended by: Margaretha Sheffield on: 02/13/2023 04:46 PM   Modules accepted: Orders

## 2023-03-14 DIAGNOSIS — H6121 Impacted cerumen, right ear: Secondary | ICD-10-CM | POA: Diagnosis not present

## 2023-05-23 DIAGNOSIS — J069 Acute upper respiratory infection, unspecified: Secondary | ICD-10-CM | POA: Diagnosis not present

## 2023-05-24 ENCOUNTER — Ambulatory Visit (INDEPENDENT_AMBULATORY_CARE_PROVIDER_SITE_OTHER): Payer: Medicare Other

## 2023-05-24 VITALS — BP 117/80 | Ht 74.0 in | Wt 246.0 lb

## 2023-05-24 DIAGNOSIS — Z0000 Encounter for general adult medical examination without abnormal findings: Secondary | ICD-10-CM | POA: Diagnosis not present

## 2023-05-24 DIAGNOSIS — Z1159 Encounter for screening for other viral diseases: Secondary | ICD-10-CM

## 2023-05-24 NOTE — Patient Instructions (Addendum)
Marcus [name] , Thank you for taking time to come for your Medicare Wellness Visit. I appreciate your ongoing commitment to your health goals. Please review the following plan we discussed and let me know if I can assist you in the future.   Referrals/Orders/Follow-Ups/Clinician Recommendations:  Next Medicare Annual Wellness Visit: May 29, 2024 at 9:20 an virtual visit  A Hepatitis C Screening has been ordered for you today. You do not have to fast to have this lab drawn. Have it drawn at the same lab you have your routine labs drawn for your pcp.  You are due for the vaccines checked below. You may have these done at your preferred pharmacy. Please have them fax the office proof of the vaccines so that we can update your chart.   []  Flu (due annually)  Recommended this fall either at PCP office or through your local pharmacy. The flu season starts August 1 of each year.   [x]  Shingrix (Shingles vaccine): CDC recommends 2 doses of Shingrix separated by 2-6 months for aged 72 years and older:  [x]  Pneumonia Vaccines: Recommended for adults 65 years or older  [x]  TDAP (Tetanus) Vaccine every 10 years:Recommended every 10 years; Please call your insurance company to determine your out of pocket expense. You also receive this vaccine at your local pharmacy or Health Dept.  [x]  Covid-19: Available now at any [facility] pharmacy (see info below)  You may also get your vaccines at any [facility] (locations listed below.) Vaccine hours are Monday - Friday 9:00 - 4:00. No appointments are required. Most insurances are accepted including Medicaid. Anyone can use the community pharmacies, and people are not required to have a [facility] provider.  Community Pharmacy Locations offering vaccines:   Sport and exercise psychologist   [facility] [place] [facility]  10 vaccines are offered at the [employer]:  Covid, flu, Tdap, shingles, RSV, pneumonia, meningococcal, hepatitis A, hepatitis B, and HPV.   This is a list of the screening recommended for you and due dates:  Health Maintenance  Topic Date Due   DTaP/Tdap/Td vaccine (1 - Tdap) Never done   Pneumonia Vaccine (1 of 2 - PCV) 07/09/2023*   COVID-19 Vaccine (3 - Mixed Product risk series) 07/09/2023*   Hepatitis C Screening  07/09/2023*   Zoster (Shingles) Vaccine (2 of 2) 07/09/2023*   Medicare Annual Wellness Visit  05/23/2024   Colon Cancer Screening  08/22/2027   Flu Shot  Completed   HPV Vaccine  Aged Out  *Topic was postponed. The date shown is not the original due date.    Advanced directives: (ACP Link)Information on Advanced Care Planning can be found at [facility] Advance Health Care Directives Advance Health Care Directives ([email or web address]   Next Medicare Annual Wellness Visit scheduled for next year: yes  Preventive Care 65 Years and Older, Male Preventive care refers to lifestyle choices and visits with your health care provider that can promote health and wellness. Preventive care visits are also called wellness exams. What can I expect for my preventive care visit? Counseling During your preventive care visit, your health care provider may ask about your: Medical history, including: Past medical problems. Family medical history. History of falls. Current health, including: Emotional well-being. Home life and relationship well-being. Sexual activity. Memory and ability to understand (cognition). Lifestyle, including: Alcohol, nicotine or tobacco, and drug use. Access to firearms. Diet,  exercise, and sleep habits. Work and work Astronomer. Sunscreen use. Safety issues such as seatbelt and bike helmet use. Physical exam Your health care provider will check your: Height and weight. These may be used to calculate your BMI (body mass index). BMI is a measurement that tells if you are at a  healthy weight. Waist circumference. This measures the distance around your waistline. This measurement also tells if you are at a healthy weight and may help predict your risk of certain diseases, such as type 2 diabetes and high blood pressure. Heart rate and blood pressure. Body temperature. Skin for abnormal spots. What immunizations do I need?  Vaccines are usually given at various ages, according to a schedule. Your health care provider will recommend vaccines for you based on your age, medical history, and lifestyle or other factors, such as travel or where you work. What tests do I need? Screening Your health care provider may recommend screening tests for certain conditions. This may include: Lipid and cholesterol levels. Diabetes screening. This is done by checking your blood sugar (glucose) after you have not eaten for a while (fasting). Hepatitis C test. Hepatitis B test. HIV (human immunodeficiency virus) test. STI (sexually transmitted infection) testing, if you are at risk. Lung cancer screening. Colorectal cancer screening. Prostate cancer screening. Abdominal aortic aneurysm (AAA) screening. You may need this if you are a current or former smoker. Talk with your health care provider about your test results, treatment options, and if necessary, the need for more tests. Follow these instructions at home: Eating and drinking  Eat a diet that includes fresh fruits and vegetables, whole grains, lean protein, and low-fat dairy products. Limit your intake of foods with high amounts of sugar, saturated fats, and salt. Take vitamin and mineral supplements as recommended by your health care provider. Do not drink alcohol if your health care provider tells you not to drink. If you drink alcohol: Limit how much you have to 0-2 drinks a day. Know how much alcohol is in your drink. In the [place] one drink equals one 12 oz bottle of beer (355 mL), one 5 oz glass of wine (148 mL), or  one 1 oz glass of hard liquor (44 mL). Lifestyle Brush your teeth every morning and night with fluoride toothpaste. Floss one time each day. Exercise for at least 30 minutes 5 or more days each week. Do not use any products that contain nicotine or tobacco. These products include cigarettes, chewing tobacco, and vaping devices, such as e-cigarettes. If you need help quitting, ask your health care provider. Do not use drugs. If you are sexually active, practice safe sex. Use a condom or other form of protection to prevent STIs. Take aspirin only as told by your health care provider. Make sure that you understand how much to take and what form to take. Work with your health care provider to find out whether it is safe and beneficial for you to take aspirin daily. Ask your health care provider if you need to take a cholesterol-lowering medicine (statin). Find healthy ways to manage stress, such as: Meditation, yoga, or listening to music. Journaling. Talking to a trusted person. Spending time with friends and family. Safety Always wear your seat belt while driving or riding in a vehicle. Do not drive: If you have been drinking alcohol. Do not ride with someone who has been drinking. When you are tired or distracted. While texting. If you have been using any mind-altering substances or drugs. Wear a  helmet and other protective equipment during sports activities. If you have firearms in your house, make sure you follow all gun safety procedures. Minimize exposure to UV radiation to reduce your risk of skin cancer. What's next? Visit your health care provider once a year for an annual wellness visit. Ask your health care provider how often you should have your eyes and teeth checked. Stay up to date on all vaccines. This information is not intended to replace advice given to you by your health care provider. Make sure you discuss any questions you have with your health care provider. Document  Revised: 09/21/2020 Document Reviewed: 09/21/2020 Elsevier Patient Education  2024 [employer].  Understanding Your Risk for Falls Millions of people have serious injuries from falls each year. It is important to understand your risk of falling. Talk with your health care provider about your risk and what you can do to lower it. If you do have a serious fall, make sure to tell your provider. Falling once raises your risk of falling again. How can falls affect me? Serious injuries from falls are common. These include: Broken bones, such as hip fractures. Head injuries, such as traumatic brain injuries (TBI) or concussions. A fear of falling can cause you to avoid activities and stay at home. This can make your muscles weaker and raise your risk for a fall. What can increase my risk? There are a number of risk factors that increase your risk for falling. The more risk factors you have, the higher your risk of falling. Serious injuries from a fall happen most often to people who are older than 74 years old. Teenagers and young adults ages 29-29 are also at higher risk. Common risk factors include: Weakness in the lower body. Being generally weak or confused due to long-term (chronic) illness. Dizziness or balance problems. Poor vision. Medicines that cause dizziness or drowsiness. These may include: Medicines for your blood pressure, heart, anxiety, insomnia, or swelling (edema). Pain medicines. Muscle relaxants. Other risk factors include: Drinking alcohol. Having had a fall in the past. Having foot pain or wearing improper footwear. Working at a dangerous job. Having any of the following in your home: Tripping hazards, such as floor clutter or loose rugs. Poor lighting. Pets. Having dementia or memory loss. What actions can I take to lower my risk of falling?     Physical activity Stay physically fit. Do strength and balance exercises. Consider taking a regular class to build  strength and balance. Yoga and tai chi are good options. Vision Have your eyes checked every year and your prescription for glasses or contacts updated as needed. Shoes and walking aids Wear non-skid shoes. Wear shoes that have rubber soles and low heels. Do not wear high heels. Do not walk around the house in socks or slippers. Use a cane or walker as told by your provider. Home safety Attach secure railings on both sides of your stairs. Install grab bars for your bathtub, shower, and toilet. Use a non-skid mat in your bathtub or shower. Attach bath mats securely with double-sided, non-slip rug tape. Use good lighting in all rooms. Keep a flashlight near your bed. Make sure there is a clear path from your bed to the bathroom. Use night-lights. Do not use throw rugs. Make sure all carpeting is taped or tacked down securely. Remove all clutter from walkways and stairways, including extension cords. Repair uneven or broken steps and floors. Avoid walking on icy or slippery surfaces. Walk on the grass  instead of on icy or slick sidewalks. Use ice melter to get rid of ice on walkways in the winter. Use a cordless phone. Questions to ask your health care provider Can you help me check my risk for a fall? Do any of my medicines make me more likely to fall? Should I take a vitamin D supplement? What exercises can I do to improve my strength and balance? Should I make an appointment to have my vision checked? Do I need a bone density test to check for weak bones (osteoporosis)? Would it help to use a cane or a walker? Where to find more information Centers for Disease Control and Prevention, STEADI: TonerPromos.no Community-Based Fall Prevention Programs: TonerPromos.no [employer] on Aging: BaseRingTones.pl Contact a health care provider if: You fall at home. You are afraid of falling at home. You feel weak, drowsy, or dizzy. This information is not intended to replace advice given to you by your  health care provider. Make sure you discuss any questions you have with your health care provider. Document Revised: 11/27/2021 Document Reviewed: 11/27/2021 Elsevier Patient Education  2024 [employer].

## 2023-05-24 NOTE — Progress Notes (Signed)
Because this visit was a virtual/telehealth visit,  certain criteria was not obtained, such a blood pressure, CBG if applicable, and timed get up and go. Any medications not marked as "taking" were not mentioned during the medication reconciliation part of the visit. Any vitals not documented were not able to be obtained due to this being a telehealth visit or patient was unable to self-report a recent blood pressure reading due to a lack of equipment at home via telehealth. Vitals that have been documented are verbally provided by the patient.  Interactive audio and video telecommunications were attempted between this provider and patient, however failed, due to patient having technical difficulties OR patient did not have access to video capability.  We continued and completed visit with audio only.  Subjective:   Marcus [name] is a 73 y.o. male who presents for Medicare Annual/Subsequent preventive examination.  Visit Complete: Virtual I connected with  Marcus [name] on 05/24/23 by a audio enabled telemedicine application and verified that I am speaking with the correct person using two identifiers.  Patient Location: Home  Provider Location: Home Office  I discussed the limitations of evaluation and management by telemedicine. The patient expressed understanding and agreed to proceed.  Vital Signs: Because this visit was a virtual/telehealth visit, some criteria may be missing or patient reported. Any vitals not documented were not able to be obtained and vitals that have been documented are patient reported.  Patient Medicare AWV questionnaire was completed by the patient on 05/21/2023; I have confirmed that all information answered by patient is correct and no changes since this date.  Cardiac Risk Factors include: advanced age (>54men, >15 women);hypertension;male gender;obesity (BMI >30kg/m2);sedentary lifestyle     Objective:    Today's Vitals   05/24/23 0859  BP: 117/80   Weight: 246 lb (111.6 kg)  Height: 6\' 2"  (1.88 m)   Body mass index is 31.58 kg/m.     05/24/2023    9:01 AM 09/07/2022   11:22 AM 08/21/2017   12:06 PM  Advanced Directives  Does Patient Have a Medical Advance Directive? No No No  Would patient like information on creating a medical advance directive? No - Patient declined  Yes (MAU/Ambulatory/Procedural Areas - Information given)    Current Medications (verified) Outpatient Encounter Medications as of 05/24/2023  Medication Sig   hydrochlorothiazide (HYDRODIURIL) 25 MG tablet TAKE 1 TABLET (25 MG TOTAL) BY MOUTH DAILY.   losartan (COZAAR) 50 MG tablet Take 1 tablet (50 mg total) by mouth daily.   Multiple Vitamin (MULTIVITAMIN WITH MINERALS) TABS tablet Take 1 tablet by mouth daily.   pantoprazole (PROTONIX) 40 MG tablet TAKE 1 TABLET BY MOUTH EVERY DAY   tamsulosin (FLOMAX) 0.4 MG CAPS capsule Take 2 capsules (0.8 mg total) by mouth daily.   No facility-administered encounter medications on file as of 05/24/2023.    Allergies (verified) Patient has no known allergies.   History: Past Medical History:  Diagnosis Date   Hypertension    Past Surgical History:  Procedure Laterality Date   CHOLECYSTECTOMY     COLONOSCOPY     COLONOSCOPY N/A 08/21/2017   Procedure: COLONOSCOPY;  Surgeon: Corbin Ade, MD;  [facility];  Service: Endoscopy;  Laterality: N/A;  10:30   Family History  Problem Relation Age of Onset   Parkinson's disease Mother    Congestive Heart Failure Father    Social History   Socioeconomic History   Marital status: Married    Spouse name: Not  on file   Number of children: Not on file   Years of education: Not on file   Highest education level: 12th grade  Occupational History   Not on file  Tobacco Use   Smoking status: Never   Smokeless tobacco: Never  Vaping Use   Vaping status: Never Used  Substance and Sexual Activity   Alcohol use: No   Drug use: No   Sexual activity:  Not on file  Other Topics Concern   Not on file  Social History Narrative   Not on file   Social Drivers of Health   Financial Resource Strain: Low Risk  (05/21/2023)   Overall Financial Resource Strain (CARDIA)    Difficulty of Paying Living Expenses: Not hard at all  Food Insecurity: No Food Insecurity (05/21/2023)   Hunger Vital Sign    Worried About Running Out of Food in the Last Year: Never true    Ran Out of Food in the Last Year: Never true  Transportation Needs: No Transportation Needs (05/21/2023)   PRAPARE - Administrator, Civil Service (Medical): No    Lack of Transportation (Non-Medical): No  Physical Activity: Insufficiently Active (05/21/2023)   Exercise Vital Sign    Days of Exercise per Week: 3 days    Minutes of Exercise per Session: 20 min  Stress: Stress Concern Present (05/21/2023)   [employer] of Occupational Health - Occupational Stress Questionnaire    Feeling of Stress : To some extent  Social Connections: Moderately Integrated (05/21/2023)   Social Connection and Isolation Panel [NHANES]    Frequency of Communication with Friends and Family: More than three times a week    Frequency of Social Gatherings with Friends and Family: Once a week    Attends Religious Services: More than 4 times per year    Active Member of [employer] or Organizations: No    Attends Engineer, structural: Never    Marital Status: Married    Tobacco Counseling Counseling given: Yes   Clinical Intake:  Pre-visit preparation completed: Yes  Pain : No/denies pain     BMI - recorded: 31.28 Nutritional Status: BMI > 30  Obese Nutritional Risks: None Diabetes: No  How often do you need to have someone help you when you read instructions, pamphlets, or other written materials from your doctor or pharmacy?: 1 - Never  Interpreter Needed?: No  Information entered by :: Maryjean Ka CMA   Activities of Daily Living    05/24/2023    9:01 AM  In your  present state of health, do you have any difficulty performing the following activities:  Hearing? 0  Vision? 0  Difficulty concentrating or making decisions? 0  Walking or climbing stairs? 0  Dressing or bathing? 0  Doing errands, shopping? 0  Preparing Food and eating ? N  Using the Toilet? N  In the past six months, have you accidently leaked urine? N  Do you have problems with loss of bowel control? N  Managing your Medications? N  Managing your Finances? N  Housekeeping or managing your Housekeeping? N    Patient Care Team: Tommie Sams, DO as PCP - General (Family Medicine) Jena Gauss Gerrit Friends, MD as Consulting Physician (Gastroenterology)  Indicate any recent Medical Services you may have received from other than [facility] providers in the past year (date may be approximate).     Assessment:   This is a routine wellness examination for [place].  Hearing/Vision screen Hearing Screening -  Comments:: Patient wears hearing aids. Up to date with exams. Sees Dr. Aleene Davidson Vision Screening - Comments:: Wears rx glasses - up to date with routine eye exams  Sees Dr. Marchelle Gearing    Goals Addressed             This Visit's Progress    Patient Stated       To lose weight and remain active and healthy        Depression Screen    05/24/2023    9:03 AM 01/29/2023    8:45 AM 08/01/2022    2:33 PM  PHQ 2/9 Scores  PHQ - 2 Score 0 0 0  PHQ- 9 Score 0 0 1    Fall Risk    05/24/2023    9:02 AM 01/29/2023    8:45 AM  Fall Risk   Falls in the past year? 0 0  Number falls in past yr: 0   Injury with Fall? 0   Risk for fall due to : No Fall Risks   Follow up Falls prevention discussed;Falls evaluation completed     MEDICARE RISK AT HOME: Medicare Risk at Home Any stairs in or around the home?: Yes If so, are there any without handrails?: No Home free of loose throw rugs in walkways, pet beds, electrical cords, etc?: Yes Adequate lighting in your home to reduce risk of  falls?: Yes Life alert?: No Use of a cane, walker or w/c?: No Grab bars in the bathroom?: No Shower chair or bench in shower?: Yes Elevated toilet seat or a handicapped toilet?: Yes  TIMED UP AND GO:  Was the test performed?  No    Cognitive Function:        05/24/2023    9:03 AM  6CIT Screen  What Year? 0 points  What month? 0 points  What time? 0 points  Count back from 20 0 points  Months in reverse 0 points  Repeat phrase 0 points  Total Score 0 points    Immunizations Immunization History  Administered Date(s) Administered   Fluad Trivalent(High Dose 65+) 01/29/2023    TDAP status: Due, Education has been provided regarding the importance of this vaccine. Advised may receive this vaccine at local pharmacy or Health Dept. Aware to provide a copy of the vaccination record if obtained from local pharmacy or Health Dept. Verbalized acceptance and understanding.  Flu Vaccine status: Up to date  Pneumococcal vaccine status: Due, Education has been provided regarding the importance of this vaccine. Advised may receive this vaccine at local pharmacy or Health Dept. Aware to provide a copy of the vaccination record if obtained from local pharmacy or Health Dept. Verbalized acceptance and understanding.  Covid-19 vaccine status: Information provided on how to obtain vaccines.   Qualifies for Shingles Vaccine? Yes   Zostavax completed No   Shingrix Completed?: No.    Education has been provided regarding the importance of this vaccine. Patient has been advised to call insurance company to determine out of pocket expense if they have not yet received this vaccine. Advised may also receive vaccine at local pharmacy or Health Dept. Verbalized acceptance and understanding.  Screening Tests Health Maintenance  Topic Date Due   DTaP/Tdap/Td (1 - Tdap) Never done   Medicare Annual Wellness (AWV)  01/09/2023   Pneumonia Vaccine 21+ Years old (1 of 2 - PCV) 07/09/2023 (Originally  03/17/1957)   COVID-19 Vaccine (3 - Mixed Product risk series) 07/09/2023 (Originally 07/29/2019)   Hepatitis C Screening  07/09/2023 (Originally 03/17/1969)   Zoster Vaccines- Shingrix (2 of 2) 07/09/2023 (Originally 07/25/2021)   Colonoscopy  08/22/2027   INFLUENZA VACCINE  Completed   HPV VACCINES  Aged Out    Health Maintenance  Health Maintenance Due  Topic Date Due   DTaP/Tdap/Td (1 - Tdap) Never done   Medicare Annual Wellness (AWV)  01/09/2023    Colorectal cancer screening: Type of screening: Colonoscopy. Completed 08/21/2017. Repeat every 10 years  Lung Cancer Screening: (Low Dose CT Chest recommended if Age 12-80 years, 20 pack-year currently smoking OR have quit w/in 15years.) does not qualify.   Lung Cancer Screening Referral: na  Additional Screening:  Hepatitis C Screening: does qualify; ordered 05/24/2023  Vision Screening: Recommended annual ophthalmology exams for early detection of glaucoma and other disorders of the eye. Is the patient up to date with their annual eye exam?  Yes  Who is the provider or what is the name of the office in which the patient attends annual eye exams? Dr. Marchelle Gearing If pt is not established with a provider, would they like to be referred to a provider to establish care? No .   Dental Screening: Recommended annual dental exams for proper oral hygiene  Diabetic Foot Exam: na  Community Resource Referral / Chronic Care Management: CRR required this visit?  No   CCM required this visit?  No     Plan:     I have personally reviewed and noted the following in the patient's chart:   Medical and social history Use of alcohol, tobacco or illicit drugs  Current medications and supplements including opioid prescriptions. Patient is not currently taking opioid prescriptions. Functional ability and status Nutritional status Physical activity Advanced directives List of other physicians Hospitalizations, surgeries, and ER visits in  previous 12 months Vitals Screenings to include cognitive, depression, and falls Referrals and appointments  In addition, I have reviewed and discussed with patient certain preventive protocols, quality metrics, and best practice recommendations. A written personalized care plan for preventive services as well as general preventive health recommendations were provided to patient.     Jordan Hawks Jamee Keach, CMA   05/24/2023   After Visit Summary: (MyChart) Due to this being a telephonic visit, the after visit summary with patients personalized plan was offered to patient via MyChart   Nurse Notes: see routing comment

## 2023-05-27 DIAGNOSIS — D225 Melanocytic nevi of trunk: Secondary | ICD-10-CM | POA: Diagnosis not present

## 2023-05-27 DIAGNOSIS — Z1283 Encounter for screening for malignant neoplasm of skin: Secondary | ICD-10-CM | POA: Diagnosis not present

## 2023-05-27 DIAGNOSIS — Z85820 Personal history of malignant melanoma of skin: Secondary | ICD-10-CM | POA: Diagnosis not present

## 2023-05-27 DIAGNOSIS — Z08 Encounter for follow-up examination after completed treatment for malignant neoplasm: Secondary | ICD-10-CM | POA: Diagnosis not present

## 2023-06-12 DIAGNOSIS — H6123 Impacted cerumen, bilateral: Secondary | ICD-10-CM | POA: Diagnosis not present

## 2023-06-13 ENCOUNTER — Other Ambulatory Visit: Payer: Self-pay | Admitting: Family Medicine

## 2023-06-17 DIAGNOSIS — S30820A Blister (nonthermal) of lower back and pelvis, initial encounter: Secondary | ICD-10-CM | POA: Diagnosis not present

## 2023-07-05 ENCOUNTER — Encounter: Payer: Self-pay | Admitting: Family Medicine

## 2023-07-08 ENCOUNTER — Other Ambulatory Visit: Payer: Self-pay | Admitting: Family Medicine

## 2023-07-08 DIAGNOSIS — N1831 Chronic kidney disease, stage 3a: Secondary | ICD-10-CM

## 2023-07-08 DIAGNOSIS — E781 Pure hyperglyceridemia: Secondary | ICD-10-CM

## 2023-07-08 DIAGNOSIS — I10 Essential (primary) hypertension: Secondary | ICD-10-CM

## 2023-07-16 DIAGNOSIS — E781 Pure hyperglyceridemia: Secondary | ICD-10-CM | POA: Diagnosis not present

## 2023-07-16 DIAGNOSIS — I10 Essential (primary) hypertension: Secondary | ICD-10-CM | POA: Diagnosis not present

## 2023-07-16 DIAGNOSIS — N1831 Chronic kidney disease, stage 3a: Secondary | ICD-10-CM | POA: Diagnosis not present

## 2023-07-17 ENCOUNTER — Other Ambulatory Visit: Payer: Self-pay

## 2023-07-17 ENCOUNTER — Encounter: Payer: Self-pay | Admitting: Family Medicine

## 2023-07-17 LAB — CMP14+EGFR
ALT: 19 IU/L (ref 0–44)
AST: 18 IU/L (ref 0–40)
Albumin: 4 g/dL (ref 3.8–4.8)
Alkaline Phosphatase: 95 IU/L (ref 44–121)
BUN/Creatinine Ratio: 13 (ref 10–24)
BUN: 17 mg/dL (ref 8–27)
Bilirubin Total: 1.1 mg/dL (ref 0.0–1.2)
CO2: 23 mmol/L (ref 20–29)
Calcium: 10.1 mg/dL (ref 8.6–10.2)
Chloride: 103 mmol/L (ref 96–106)
Creatinine, Ser: 1.35 mg/dL — ABNORMAL HIGH (ref 0.76–1.27)
Globulin, Total: 2.3 g/dL (ref 1.5–4.5)
Glucose: 84 mg/dL (ref 70–99)
Potassium: 3.8 mmol/L (ref 3.5–5.2)
Sodium: 143 mmol/L (ref 134–144)
Total Protein: 6.3 g/dL (ref 6.0–8.5)
eGFR: 55 mL/min/{1.73_m2} — ABNORMAL LOW (ref >59)

## 2023-07-17 LAB — CBC WITH DIFFERENTIAL/PLATELET
Basophils Absolute: 0.1 10*3/uL (ref 0.0–0.2)
Basos: 1 %
EOS (ABSOLUTE): 0.3 10*3/uL (ref 0.0–0.4)
Eos: 4 %
Hematocrit: 56.6 % — ABNORMAL HIGH (ref 37.5–51.0)
Hemoglobin: 18.9 g/dL — ABNORMAL HIGH (ref 13.0–17.7)
Immature Grans (Abs): 0.1 10*3/uL (ref 0.0–0.1)
Immature Granulocytes: 1 %
Lymphocytes Absolute: 1.9 10*3/uL (ref 0.7–3.1)
Lymphs: 24 %
MCH: 30.1 pg (ref 26.6–33.0)
MCHC: 33.4 g/dL (ref 31.5–35.7)
MCV: 90 fL (ref 79–97)
Monocytes Absolute: 0.9 10*3/uL (ref 0.1–0.9)
Monocytes: 11 %
Neutrophils Absolute: 4.9 10*3/uL (ref 1.4–7.0)
Neutrophils: 59 %
Platelets: 206 10*3/uL (ref 150–450)
RBC: 6.28 x10E6/uL — ABNORMAL HIGH (ref 4.14–5.80)
RDW: 13.7 % (ref 11.6–15.4)
WBC: 8.2 10*3/uL (ref 3.4–10.8)

## 2023-07-17 LAB — LIPID PANEL
Chol/HDL Ratio: 3.3 ratio (ref 0.0–5.0)
Cholesterol, Total: 146 mg/dL (ref 100–199)
HDL: 44 mg/dL (ref >39)
LDL Chol Calc (NIH): 79 mg/dL (ref 0–99)
Triglycerides: 128 mg/dL (ref 0–149)
VLDL Cholesterol Cal: 23 mg/dL (ref 5–40)

## 2023-07-17 MED ORDER — TAMSULOSIN HCL 0.4 MG PO CAPS: 0.8000 mg | ORAL_CAPSULE | Freq: Every day | ORAL | 1 refills | Status: DC

## 2023-07-18 ENCOUNTER — Other Ambulatory Visit: Payer: Self-pay

## 2023-07-18 DIAGNOSIS — R718 Other abnormality of red blood cells: Secondary | ICD-10-CM

## 2023-07-18 DIAGNOSIS — D582 Other hemoglobinopathies: Secondary | ICD-10-CM

## 2023-07-30 ENCOUNTER — Ambulatory Visit: Payer: Medicare Other | Admitting: Family Medicine

## 2023-07-30 VITALS — BP 112/78 | HR 71 | Temp 98.1°F | Ht 74.0 in | Wt 246.0 lb

## 2023-07-30 DIAGNOSIS — K219 Gastro-esophageal reflux disease without esophagitis: Secondary | ICD-10-CM

## 2023-07-30 DIAGNOSIS — D582 Other hemoglobinopathies: Secondary | ICD-10-CM | POA: Diagnosis not present

## 2023-07-30 DIAGNOSIS — N401 Enlarged prostate with lower urinary tract symptoms: Secondary | ICD-10-CM

## 2023-07-30 DIAGNOSIS — N1831 Chronic kidney disease, stage 3a: Secondary | ICD-10-CM

## 2023-07-30 DIAGNOSIS — R351 Nocturia: Secondary | ICD-10-CM

## 2023-07-30 DIAGNOSIS — I10 Essential (primary) hypertension: Secondary | ICD-10-CM

## 2023-07-30 MED ORDER — LOSARTAN POTASSIUM 50 MG PO TABS: 50.0000 mg | ORAL_TABLET | Freq: Every day | ORAL | 3 refills | Status: DC

## 2023-07-30 MED ORDER — HYDROCHLOROTHIAZIDE 25 MG PO TABS
25.0000 mg | ORAL_TABLET | Freq: Every day | ORAL | 3 refills | Status: DC
Start: 2023-07-30 — End: 2023-11-05

## 2023-07-30 NOTE — Assessment & Plan Note (Signed)
Stable on Protonix.  Continue. 

## 2023-07-30 NOTE — Assessment & Plan Note (Signed)
Stable.  Will continue to monitor closely. 

## 2023-07-30 NOTE — Patient Instructions (Signed)
 Referral placed to Hematology.  Continue your medication.  Stay active.  Follow up in 6 months.

## 2023-07-30 NOTE — Progress Notes (Signed)
 Subjective:  Patient ID: Marcus [name], male    DOB: 08-Jul-1950  Age: 73 y.o. MRN: [id number]  CC:   Chief Complaint  Patient presents with   Hypertension   lab results    HPI:  73 year old male presents for follow-up.  Patient reports that he is feeling well.  No chest pain or shortness of breath.  He states that he needs to be more active.  Trying to eat healthy.  He does note that he has some edema that gets worse as the day goes on and is better when he wakes up in the morning.  GERD stable on Protonix .  BPH stable on Flomax .  Hypertension stable on hydrochlorothiazide  and losartan .  CKD remained stable.  Most recent GFR 55.  Additionally, patient's recent labs notable for persistently elevated hemoglobin and hematocrit.  He is not a smoker.  Uncertain cause at this time.  Referral is in process.  Patient Active Problem List   Diagnosis Date Noted   Elevated hemoglobin (HCC) 07/30/2023   Hypertension 08/01/2022   BPH (benign prostatic hyperplasia) 08/01/2022   GERD (gastroesophageal reflux disease) 08/01/2022   CKD (chronic kidney disease) 08/01/2022    Social Hx   Social History   Socioeconomic History   Marital status: Married    Spouse name: Not on file   Number of children: Not on file   Years of education: Not on file   Highest education level: 12th grade  Occupational History   Not on file  Tobacco Use   Smoking status: Never   Smokeless tobacco: Never  Vaping Use   Vaping status: Never Used  Substance and Sexual Activity   Alcohol use: No   Drug use: No   Sexual activity: Not on file  Other Topics Concern   Not on file  Social History Narrative   Not on file   Social Drivers of Health   Financial Resource Strain: Low Risk  (05/21/2023)   Overall Financial Resource Strain (CARDIA)    Difficulty of Paying Living Expenses: Not hard at all  Food Insecurity: No Food Insecurity (05/21/2023)   Hunger Vital Sign    Worried About Running Out of  Food in the Last Year: Never true    Ran Out of Food in the Last Year: Never true  Transportation Needs: No Transportation Needs (05/21/2023)   PRAPARE - Administrator, Civil Service (Medical): No    Lack of Transportation (Non-Medical): No  Physical Activity: Insufficiently Active (05/21/2023)   Exercise Vital Sign    Days of Exercise per Week: 3 days    Minutes of Exercise per Session: 20 min  Stress: Stress Concern Present (05/21/2023)   [employer] of Occupational Health - Occupational Stress Questionnaire    Feeling of Stress : To some extent  Social Connections: Moderately Integrated (05/21/2023)   Social Connection and Isolation Panel [NHANES]    Frequency of Communication with Friends and Family: More than three times a week    Frequency of Social Gatherings with Friends and Family: Once a week    Attends Religious Services: More than 4 times per year    Active Member of [employer] or Organizations: No    Attends Banker Meetings: Never    Marital Status: Married    Review of Systems Per HPI  Objective:  BP 112/78   Pulse 71   Temp 98.1 F (36.7 C)   Ht 6\' 2"  (1.88 m)   Wt 246 lb (111.6 kg)  SpO2 96%   BMI 31.58 kg/m      07/30/2023    8:23 AM 05/24/2023    8:59 AM 01/29/2023    8:31 AM  BP/Weight  Systolic BP 112 117 120  Diastolic BP 78 80 77  Wt. (Lbs) 246 246 239.2  BMI 31.58 kg/m2 31.58 kg/m2 30.71 kg/m2    Physical Exam Vitals and nursing note reviewed.  Constitutional:      General: He is not in acute distress.    Appearance: Normal appearance.  HENT:     Head: Normocephalic and atraumatic.  Eyes:     General:        Right eye: No discharge.        Left eye: No discharge.     Conjunctiva/sclera: Conjunctivae normal.  Cardiovascular:     Rate and Rhythm: Normal rate and regular rhythm.  Pulmonary:     Effort: Pulmonary effort is normal.     Breath sounds: Normal breath sounds. No wheezing, rhonchi or rales.   Neurological:     Mental Status: He is alert.  Psychiatric:        Mood and Affect: Mood normal.        Behavior: Behavior normal.     Lab Results  Component Value Date   WBC 8.2 07/16/2023   HGB 18.9 (H) 07/16/2023   HCT 56.6 (H) 07/16/2023   PLT 206 07/16/2023   GLUCOSE 84 07/16/2023   CHOL 146 07/16/2023   TRIG 128 07/16/2023   HDL 44 07/16/2023   LDLCALC 79 07/16/2023   ALT 19 07/16/2023   AST 18 07/16/2023   NA 143 07/16/2023   K 3.8 07/16/2023   CL 103 07/16/2023   CREATININE 1.35 (H) 07/16/2023   BUN 17 07/16/2023   CO2 23 07/16/2023     Assessment & Plan:  Primary hypertension Assessment & Plan: Stable.  Continue losartan  and HCTZ.  Orders: -     hydroCHLOROthiazide ; Take 1 tablet (25 mg total) by mouth daily.  Dispense: 90 tablet; Refill: 3 -     Losartan  Potassium; Take 1 tablet (50 mg total) by mouth daily.  Dispense: 90 tablet; Refill: 3  Stage 3a chronic kidney disease (HCC) Assessment & Plan: Stable.  Will continue to monitor closely.   Gastroesophageal reflux disease without esophagitis Assessment & Plan: Stable on Protonix .  Continue.   Benign prostatic hyperplasia with nocturia Assessment & Plan: Stable on Flomax .  Continue.   Elevated hemoglobin (HCC) Assessment & Plan: Etiology unclear at this time.  No thrombocytopenia.  No leukocytosis or leukopenia.  He is not a smoker.  Referral to hematology is in process.     Follow-up:  Return in about 6 months (around 01/29/2024).  Kathleen Papa DO [facility]

## 2023-07-30 NOTE — Assessment & Plan Note (Signed)
 Stable on Flomax.  Continue.

## 2023-07-30 NOTE — Assessment & Plan Note (Signed)
 Etiology unclear at this time.  No thrombocytopenia.  No leukocytosis or leukopenia.  He is not a smoker.  Referral to hematology is in process.

## 2023-07-30 NOTE — Assessment & Plan Note (Signed)
Stable.  Continue losartan and HCTZ. ?

## 2023-07-31 ENCOUNTER — Inpatient Hospital Stay

## 2023-07-31 ENCOUNTER — Inpatient Hospital Stay: Attending: Oncology | Admitting: Oncology

## 2023-07-31 DIAGNOSIS — D582 Other hemoglobinopathies: Secondary | ICD-10-CM | POA: Diagnosis not present

## 2023-07-31 DIAGNOSIS — K219 Gastro-esophageal reflux disease without esophagitis: Secondary | ICD-10-CM | POA: Diagnosis not present

## 2023-07-31 DIAGNOSIS — D751 Secondary polycythemia: Secondary | ICD-10-CM

## 2023-07-31 LAB — CBC WITH DIFFERENTIAL/PLATELET
Abs Immature Granulocytes: 0.1 10*3/uL — ABNORMAL HIGH (ref 0.00–0.07)
Basophils Absolute: 0.1 10*3/uL (ref 0.0–0.1)
Basophils Relative: 1 %
Eosinophils Absolute: 0.4 10*3/uL (ref 0.0–0.5)
Eosinophils Relative: 4 %
HCT: 50.1 % (ref 39.0–52.0)
Hemoglobin: 17.3 g/dL — ABNORMAL HIGH (ref 13.0–17.0)
Immature Granulocytes: 1 %
Lymphocytes Relative: 24 %
Lymphs Abs: 2.2 10*3/uL (ref 0.7–4.0)
MCH: 29.9 pg (ref 26.0–34.0)
MCHC: 34.5 g/dL (ref 30.0–36.0)
MCV: 86.7 fL (ref 80.0–100.0)
Monocytes Absolute: 1.2 10*3/uL — ABNORMAL HIGH (ref 0.1–1.0)
Monocytes Relative: 13 %
Neutro Abs: 5.3 10*3/uL (ref 1.7–7.7)
Neutrophils Relative %: 57 %
Platelets: 193 10*3/uL (ref 150–400)
RBC: 5.78 MIL/uL (ref 4.22–5.81)
RDW: 14.1 % (ref 11.5–15.5)
WBC: 9.2 10*3/uL (ref 4.0–10.5)
nRBC: 0 % (ref 0.0–0.2)

## 2023-07-31 NOTE — Progress Notes (Unsigned)
 [facility] at [facility]  HEMATOLOGY NEW VISIT  Cook, Jayce G, DO  REASON FOR REFERRAL: Polycythemia  HISTORY OF PRESENT ILLNESS: Marcus [name] 73 y.o. male referred for polycythemia. The patient is accompanied by    I have reviewed the past medical history, past surgical history, social history and family history with the patient   ALLERGIES:  has no known allergies.  MEDICATIONS:  Current Outpatient Medications  Medication Sig Dispense Refill   hydrochlorothiazide  (HYDRODIURIL ) 25 MG tablet Take 1 tablet (25 mg total) by mouth daily. 90 tablet 3   losartan  (COZAAR ) 50 MG tablet Take 1 tablet (50 mg total) by mouth daily. 90 tablet 3   Multiple Vitamin (MULTIVITAMIN WITH MINERALS) TABS tablet Take 1 tablet by mouth daily.     pantoprazole  (PROTONIX ) 40 MG tablet TAKE 1 TABLET BY MOUTH EVERY DAY 90 tablet 1   tamsulosin  (FLOMAX ) 0.4 MG CAPS capsule Take 2 capsules (0.8 mg total) by mouth daily. 180 capsule 1   No current facility-administered medications for this visit.     REVIEW OF SYSTEMS:   Constitutional: Denies fevers, chills or night sweats Eyes: Denies blurriness of vision Ears, nose, mouth, throat, and face: Denies mucositis or sore throat Respiratory: Denies cough, dyspnea or wheezes Cardiovascular: Denies palpitation, chest discomfort or lower extremity swelling Gastrointestinal:  Denies nausea, heartburn or change in bowel habits Skin: Denies abnormal skin rashes Lymphatics: Denies new lymphadenopathy or easy bruising Neurological:Denies numbness, tingling or new weaknesses Behavioral/Psych: Mood is stable, no new changes  All other systems were reviewed with the patient and are negative.  PHYSICAL EXAMINATION:   There were no vitals filed for this visit.  GENERAL:alert, no distress and comfortable SKIN: skin color, texture, turgor are normal, no rashes or significant lesions EYES: normal, Conjunctiva are pink and non-injected,  sclera clear OROPHARYNX:no exudate, no erythema and lips, buccal mucosa, and tongue normal  NECK: supple, thyroid  normal size, non-tender, without nodularity LYMPH:  no palpable lymphadenopathy in the cervical, axillary or inguinal LUNGS: clear to auscultation and percussion with normal breathing effort HEART: regular rate & rhythm and no murmurs and no lower extremity edema ABDOMEN:abdomen soft, non-tender and normal bowel sounds Musculoskeletal:no cyanosis of digits and no clubbing  NEURO: alert & oriented x 3 with fluent speech, no focal motor/sensory deficits  LABORATORY DATA:  I have reviewed the data as listed  Lab Results  Component Value Date   WBC 8.2 07/16/2023   NEUTROABS 4.9 07/16/2023   HGB 18.9 (H) 07/16/2023   HCT 56.6 (H) 07/16/2023   MCV 90 07/16/2023   PLT 206 07/16/2023       Chemistry      Component Value Date/Time   NA 143 07/16/2023 0849   K 3.8 07/16/2023 0849   CL 103 07/16/2023 0849   CO2 23 07/16/2023 0849   BUN 17 07/16/2023 0849   CREATININE 1.35 (H) 07/16/2023 0849      Component Value Date/Time   CALCIUM 10.1 07/16/2023 0849   ALKPHOS 95 07/16/2023 0849   AST 18 07/16/2023 0849   ALT 19 07/16/2023 0849   BILITOT 1.1 07/16/2023 0849      ASSESSMENT & PLAN:  Patient is a 73 y.o. male referred for polycythemia  No problem-specific Assessment & Plan notes found for this encounter.   No orders of the defined types were placed in this encounter.   The total time spent in the appointment was {CHL ONC TIME VISIT - [id number]} encounter with patients  including review of chart and various tests results, discussions about plan of care and coordination of care plan   All questions were answered. The patient knows to call the clinic with any problems, questions or concerns. No barriers to learning was detected.   Eduardo Grade, MD 4/23/202510:14 AM

## 2023-07-31 NOTE — Patient Instructions (Signed)
 VISIT SUMMARY:  You came in today to discuss your elevated red blood cell count. We reviewed your history, including your asbestosis, possible asthma, and symptoms that may suggest sleep apnea. We discussed the potential causes of your elevated red blood cell count and planned further tests to determine the exact cause.  YOUR PLAN:  -ELEVATED RED BLOOD CELL COUNT: An elevated red blood cell count means that there are more red blood cells in your blood than normal, which can be caused by various conditions including low oxygen levels, lung diseases, or bone marrow disorders. We will conduct tests to measure your EPO level and check for the JAK2 mutation to help identify the cause. Additionally, we will perform an abdominal ultrasound.  -POSSIBLE SLEEP APNEA: Sleep apnea is a condition where breathing repeatedly stops and starts during sleep. Your symptoms suggest you might have sleep apnea, so we recommend a sleep study to confirm the diagnosis and see if it is contributing to your elevated red blood cell count.  INSTRUCTIONS:  Please complete the following tests: EPO level, JAK2 mutation testing, and an abdominal ultrasound. Additionally, schedule a sleep study to evaluate for sleep apnea. Follow up with your pulmonologist for your annual breathing tests and CT scans. We will review the results of these tests at your next appointment.

## 2023-08-01 LAB — ERYTHROPOIETIN: Erythropoietin: 8.1 m[IU]/mL (ref 2.6–18.5)

## 2023-08-01 NOTE — Assessment & Plan Note (Signed)
 Recent elevation in hemoglobin.  Differential includes hypoxia from sleep apnea, chronic lung conditions or bone marrow disorders.  Asbestosis and possible asthma may contribute to hypoxia  - Will obtain labs including EPO and JAK2 mutation testing. -Will also obtain ultrasound of abdomen to rule out renal or hepatic causes - Sleep study referral for evaluation of sleep.  Return to clinic in 2 weeks to discuss results

## 2023-08-05 LAB — JAK2 V617F RFX CALR/MPL/E12-15

## 2023-08-05 LAB — CALR +MPL + E12-E15  (REFLEX)

## 2023-08-11 ENCOUNTER — Other Ambulatory Visit: Payer: Self-pay | Admitting: Family Medicine

## 2023-08-12 ENCOUNTER — Other Ambulatory Visit: Payer: Self-pay

## 2023-08-12 ENCOUNTER — Ambulatory Visit (HOSPITAL_COMMUNITY)
Admission: RE | Admit: 2023-08-12 | Discharge: 2023-08-12 | Disposition: A | Discharge status: Home / Self Care | Source: Ambulatory Visit | Attending: Oncology | Admitting: Oncology

## 2023-08-12 DIAGNOSIS — D751 Secondary polycythemia: Secondary | ICD-10-CM | POA: Diagnosis not present

## 2023-08-12 DIAGNOSIS — R161 Splenomegaly, not elsewhere classified: Secondary | ICD-10-CM | POA: Diagnosis not present

## 2023-08-12 DIAGNOSIS — Z9049 Acquired absence of other specified parts of digestive tract: Secondary | ICD-10-CM | POA: Diagnosis not present

## 2023-08-12 DIAGNOSIS — R932 Abnormal findings on diagnostic imaging of liver and biliary tract: Secondary | ICD-10-CM | POA: Diagnosis not present

## 2023-08-12 MED ORDER — TAMSULOSIN HCL 0.4 MG PO CAPS: 0.8000 mg | ORAL_CAPSULE | Freq: Every day | ORAL | 1 refills | Status: DC

## 2023-08-14 ENCOUNTER — Ambulatory Visit: Admitting: Oncology

## 2023-08-19 ENCOUNTER — Inpatient Hospital Stay: Attending: Oncology | Admitting: Oncology

## 2023-08-19 VITALS — BP 121/77 | HR 69 | Temp 97.8°F | Resp 18 | Ht 74.0 in | Wt 245.8 lb

## 2023-08-19 DIAGNOSIS — D582 Other hemoglobinopathies: Secondary | ICD-10-CM | POA: Insufficient documentation

## 2023-08-19 DIAGNOSIS — Z79899 Other long term (current) drug therapy: Secondary | ICD-10-CM | POA: Diagnosis not present

## 2023-08-19 DIAGNOSIS — D751 Secondary polycythemia: Secondary | ICD-10-CM

## 2023-08-19 NOTE — Progress Notes (Signed)
 [facility] at [facility]  HEMATOLOGY FOLLOW-UP VISIT  Marcus Ast, DO  REASON FOR FOLLOW-UP: Secondary polycythemia  ASSESSMENT & PLAN:  Patient is a 73 y.o. male following for secondary polycythemia  Elevated hemoglobin (HCC) Patient likely has secondary polycythemia from probable sleep apnea, asthma and asbestosis exposure.  Less likely from the benign renal cyst.  No history of smoking or alcohol use EPO: normal, indicating secondary etiology for polycythemia JAK2/MPL/CALR/E 12-15: Negative Ultrasound abdomen: Showed some hepatic echotexture but no cyst in liver.  Benign cyst of 1.3 cm and left kidney.  - No indication for phlebotomy at this time.  Repeat labs actually showed improvement in hemoglobin and hematocrit - Please follow-up with sleep study referral  Return to clinic in 3 months with labs.   Orders Placed This Encounter  Procedures   CBC with Differential/Platelet    Standing Status:   Future    Expected Date:   11/18/2023    Expiration Date:   08/18/2024   Comprehensive metabolic panel with GFR    Standing Status:   Future    Expected Date:   11/18/2023    Expiration Date:   08/18/2024    The total time spent in the appointment was 20 minutes encounter with patients including review of chart and various tests results, discussions about plan of care and coordination of care plan   All questions were answered. The patient knows to call the clinic with any problems, questions or concerns. No barriers to learning was detected.  Marcus Grade, MD 5/12/20259:57 AM   SUMMARY OF HEMATOLOGIC HISTORY: Secondary polycythemia -EPO: Normal -JAK2/MPL/CALR/E 12-15: Negative -Ultrasound abdomen: Showed some hepatic echotexture but no cyst in liver.  Benign cyst of 1.3 cm in the left kidney. -Sleep study evaluation: Pending   INTERVAL HISTORY: Marcus [name] 73 y.o. male following for polycythemia. Patient was accompanied by his wife  today.  No complaints today. We discussed that his erythropoietin  was normal.  Ultrasound did not show any  liver cysts but does have a benign kidney cyst.  He experiences symptoms consistent with sleep apnea, such as snoring, waking up tired, and daytime sleepiness. He often naps during the day and feels sleepy when sitting down, especially in a recliner.   I have reviewed the past medical history, past surgical history, social history and family history with the patient   ALLERGIES:  has no known allergies.  MEDICATIONS:  Current Outpatient Medications  Medication Sig Dispense Refill   hydrochlorothiazide  (HYDRODIURIL ) 25 MG tablet Take 1 tablet (25 mg total) by mouth daily. 90 tablet 3   losartan  (COZAAR ) 50 MG tablet Take 1 tablet (50 mg total) by mouth daily. 90 tablet 3   Multiple Vitamin (MULTIVITAMIN WITH MINERALS) TABS tablet Take 1 tablet by mouth daily.     pantoprazole  (PROTONIX ) 40 MG tablet TAKE 1 TABLET BY MOUTH EVERY DAY 90 tablet 1   tamsulosin  (FLOMAX ) 0.4 MG CAPS capsule Take 2 capsules (0.8 mg total) by mouth daily. 180 capsule 1   No current facility-administered medications for this visit.     REVIEW OF SYSTEMS:   Constitutional: Denies fevers, chills or night sweats Eyes: Denies blurriness of vision Ears, nose, mouth, throat, and face: Denies mucositis or sore throat Respiratory: Denies cough, dyspnea or wheezes Cardiovascular: Denies palpitation, chest discomfort or lower extremity swelling Gastrointestinal:  Denies nausea, heartburn or change in bowel habits Skin: Denies abnormal skin rashes Lymphatics: Denies new lymphadenopathy or easy bruising Neurological:Denies numbness, tingling  or new weaknesses Behavioral/Psych: Mood is stable, no new changes  All other systems were reviewed with the patient and are negative.  PHYSICAL EXAMINATION:   Vitals:   08/19/23 0812  BP: 121/77  Pulse: 69  Resp: 18  Temp: 97.8 F (36.6 C)  SpO2: 98%     GENERAL:alert, no distress and comfortable SKIN: skin color, texture, turgor are normal, no rashes or significant lesions LUNGS: clear to auscultation and percussion with normal breathing effort HEART: regular rate & rhythm and no murmurs and no lower extremity edema ABDOMEN:abdomen soft, non-tender and normal bowel sounds Musculoskeletal:no cyanosis of digits and no clubbing  NEURO: alert & oriented x 3 with fluent speech  LABORATORY DATA:  I have reviewed the data as listed  Lab Results  Component Value Date   WBC 9.2 07/31/2023   NEUTROABS 5.3 07/31/2023   HGB 17.3 (H) 07/31/2023   HCT 50.1 07/31/2023   MCV 86.7 07/31/2023   PLT 193 07/31/2023      Chemistry      Component Value Date/Time   NA 143 07/16/2023 0849   K 3.8 07/16/2023 0849   CL 103 07/16/2023 0849   CO2 23 07/16/2023 0849   BUN 17 07/16/2023 0849   CREATININE 1.35 (H) 07/16/2023 0849      Component Value Date/Time   CALCIUM 10.1 07/16/2023 0849   ALKPHOS 95 07/16/2023 0849   [name] 18 07/16/2023 0849   ALT 19 07/16/2023 0849   BILITOT 1.1 07/16/2023 0849      Latest Reference Range & Units 07/31/23 10:51  Erythropoietin  2.6 - 18.5 mIU/mL 8.1    07/31/23 10:51  CALR +MPL + E12-E15  (REFLEX) Negative  JAK2 [id number] RFX CALR/MPL/E12-15 Negative    RADIOGRAPHIC STUDIES: I have personally reviewed the radiological images as listed and agreed with the findings in the report.  US  Abdomen Complete CLINICAL DATA:  Polycythemia rule out hepatosplenomegaly  EXAM: ABDOMEN ULTRASOUND COMPLETE  COMPARISON:  April 29, 2018.  FINDINGS: Evaluation is limited by limited acoustic windows and shadowing bowel gas.  Gallbladder: Surgically absent  Common bile duct: Diameter: Visualized portion measures 5 mm, within normal limits.  Liver: Evaluation is limited by shadowing bowel gas and poor acoustic windows. No definitive focal lesion identified. Mildly heterogeneous in parenchymal echogenicity with  poor acoustic penetration. Portal vein is grossly patent on color Doppler imaging with normal direction of blood flow towards the liver.  IVC: No abnormality visualized within the limitations of the exam.  Pancreas: Not visualized secondary to shadowing bowel gas.  Spleen: Spleen is enlarged spanning 13.2 x 13.9 x 5.3 cm for an estimated volume of 507 ML.  Right Kidney: Length: 10.9 cm. Echogenicity within normal limits. No mass or hydronephrosis visualized.  Left Kidney: Length: 11.8 cm. Echogenicity within normal limits. No hydronephrosis visualized. Benign cysts is noted measuring 1.3 cm (for which no dedicated imaging follow-up is recommended) .  Abdominal aorta: No aneurysm visualized. The majority is not visualized secondary to shadowing bowel gas.  Other findings: None.  IMPRESSION: 1. Splenomegaly with an estimated volume of 507 mL. 2. Mildly heterogeneous appearance of the liver on technically difficult examination is nonspecific but could reflect underlying hepatocellular disease.  Electronically Signed   By: Clancy Crimes M.D.   On: 08/12/2023 09:00

## 2023-08-19 NOTE — Assessment & Plan Note (Addendum)
 Patient likely has secondary polycythemia from probable sleep apnea, asthma and asbestosis exposure.  Less likely from the benign renal cyst.  No history of smoking or alcohol use EPO: normal, indicating secondary etiology for polycythemia JAK2/MPL/CALR/E 12-15: Negative Ultrasound abdomen: Showed some hepatic echotexture but no cyst in liver.  Benign cyst of 1.3 cm and left kidney.  - No indication for phlebotomy at this time.  Repeat labs actually showed improvement in hemoglobin and hematocrit - Please follow-up with sleep study referral  Return to clinic in 3 months with labs.

## 2023-09-16 ENCOUNTER — Ambulatory Visit: Admitting: Neurology

## 2023-09-16 ENCOUNTER — Encounter: Payer: Self-pay | Admitting: Neurology

## 2023-09-16 VITALS — BP 117/75 | HR 74 | Ht 74.0 in | Wt 248.0 lb

## 2023-09-16 DIAGNOSIS — D582 Other hemoglobinopathies: Secondary | ICD-10-CM | POA: Diagnosis not present

## 2023-09-16 DIAGNOSIS — G478 Other sleep disorders: Secondary | ICD-10-CM | POA: Diagnosis not present

## 2023-09-16 DIAGNOSIS — R918 Other nonspecific abnormal finding of lung field: Secondary | ICD-10-CM | POA: Insufficient documentation

## 2023-09-16 NOTE — Addendum Note (Signed)
 Addended by: Neomia Banner on: 09/16/2023 11:16 AM   Modules accepted: Orders

## 2023-09-16 NOTE — Patient Instructions (Addendum)
 ASSESSMENT AND PLAN 73 y.o. year old male  here with: Marcus [name] 73 y.o. male referred for polycythemia.  November 2024  first time abnormal hemoglobin.   He has no symptoms such as itching after hot showers or facial plethora, fever, chills, night sweats, weight loss, and loss of appetite.  He also denies having sleep apnea but reports heavy snoring and occasional daytime sleepiness, especially while watching TV.  He goes to bed early around 9 PM and is up at 4.30, no alarm needed.  He has been acting out dreams, has been sleep talking and supposingly sleep walking.  He does not fall asleep while driving.   Epworth score and fatigue score were low.  He uses 2 pillows to sleep due to severe acid reflux and sleeps on his left .he has had a gallbladder surgery over a decade ago.    He has a history of "asbestosis" affecting the lower part of his right lung and undergoes annual breathing tests and CT scans.  Recently, a new doctor suggested possible asthma contributing to his shortness of breath. He experiences shortness of breath with quick activities and in humid conditions.   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 He is retired and lives with his wife. No significant weight loss or loss of appetite, although he feels full after small meals. No family history of cancer.   Patient has no history of smoking or alcohol use.   I have also reviewed the past medical history, past surgical history, social history and family history with the patient      HERE FOR :  secondary  polycythemia    Other  findings: strong essential tremor.  Hearing loss,  pulmonary disease.    Plan to evaluate for  sleep apnea, hypoxia and he wants to have a HST.   If there is hypoxia but not apnea, will defer to pulmonology.    ' If his sleep behaviour accelerates, I will need him in lab.     Here primarily to look at sleep related causes for   high hematocrit and Hemoglobin, secondarily to  Hypoxia ?    Already was evaluated and ruled out :to medication. Has asbestosis Lung disease,  No AVM, or testosterone supplements have been looked at.        I plan to follow up either personally or through our NP within 4-5 months.    I would like to thank Cook, Jayce G, DO and [place], [place], [place] [place],  [place] [address] for allowing me to meet with and to take care of this pleasant patient.     Quality Sleep Information, Adult Quality sleep is important for your mental and physical health. It also improves your quality of life. Quality sleep means you: Are asleep for most of the time you are in bed. Fall asleep within 30 minutes. Wake up no more than once a night. Are awake for no longer than 20 minutes if you do wake up during the night. Most adults need 7-8 hours of quality sleep each night. How can poor sleep affect me? If you do not get enough quality sleep, you may have: Mood swings. Daytime sleepiness. Decreased alertness, reaction time, and concentration. Sleep disorders, such as insomnia and sleep apnea. Difficulty with: Solving problems. Coping with stress. Paying attention. These issues may affect your performance and productivity at work, school, and home. Lack of  sleep may also put you at higher risk for accidents, suicide, and risky behaviors. If you do not get quality sleep, you may also be at higher risk for several health problems, including: Infections. Type 2 diabetes. Heart disease. High blood pressure. Obesity. Worsening of long-term conditions, like arthritis, kidney disease, depression, Parkinson's disease, and epilepsy. What actions can I take to get more quality sleep? Sleep schedule and routine Stick to a sleep schedule. Go to sleep and wake up at about the same time each day. Do not try to sleep less on weekdays and make up for lost sleep on weekends. This does not work. Limit naps  during the day to 30 minutes or less. Do not take naps in the late afternoon. Make time to relax before bed. Reading, listening to music, or taking a hot bath promotes quality sleep. Make your bedroom a place that promotes quality sleep. Keep your bedroom dark, quiet, and at a comfortable room temperature. Make sure your bed is comfortable. Avoid using electronic devices that give off bright blue light for 30 minutes before bedtime. Your brain perceives bright blue light as sunlight. This includes television, phones, and computers. If you are lying awake in bed for longer than 20 minutes, get up and do a relaxing activity until you feel sleepy. Lifestyle     Try to get at least 30 minutes of exercise on most days. Do not exercise 2-3 hours before going to bed. Do not use any products that contain nicotine or tobacco. These products include cigarettes, chewing tobacco, and vaping devices, such as e-cigarettes. If you need help quitting, ask your health care provider. Do not drink caffeinated beverages for at least 8 hours before going to bed. Coffee, tea, and some sodas contain caffeine. Do not drink alcohol or eat large meals close to bedtime. Try to get at least 30 minutes of sunlight every day. Morning sunlight is best. Medical concerns Work with your health care provider to treat medical conditions that may affect sleeping, such as: Nasal obstruction. Snoring. Sleep apnea and other sleep disorders. Talk to your health care provider if you think any of your prescription medicines may cause you to have difficulty falling or staying asleep. If you have sleep problems, talk with a sleep consultant. If you think you have a sleep disorder, talk with your health care provider about getting evaluated by a specialist. Where to find more information Sleep Foundation: sleepfoundation.org American Academy of Sleep Medicine: aasm.org Centers for Disease Control and Prevention (CDC): TonerPromos.no Contact a  health care provider if: You have trouble getting to sleep or staying asleep. You often wake up very early in the morning and cannot get back to sleep. You have daytime sleepiness. You have daytime sleep attacks of suddenly falling asleep and sudden muscle weakness (narcolepsy). You have a tingling sensation in your legs with a strong urge to move your legs (restless legs syndrome). You stop breathing briefly during sleep (sleep apnea). You think you have a sleep disorder or are taking a medicine that is affecting your quality of sleep. Summary Most adults need 7-8 hours of quality sleep each night. Getting enough quality sleep is important for your mental and physical health. Make your bedroom a place that promotes quality sleep, and avoid things that may cause you to have poor sleep, such as alcohol, caffeine, smoking, or large meals. Talk to your health care provider if you have trouble falling asleep or staying asleep. This information is not intended to replace advice  given to you by your health care provider. Make sure you discuss any questions you have with your health care provider. Document Revised: 07/19/2021 Document Reviewed: 07/19/2021 Elsevier Patient Education  2024 Elsevier Inc.    Sleep Apnea Test: What to Expect  Sleep apnea is a condition that affects your breathing while you're sleeping. You may have shallow breathing or stop breathing for short periods of time. Sleep apnea screening is a test to check if you're at risk for sleep apnea. The test includes questions. It will only takes a few minutes. Your health care provider may ask you to have this test before a surgery or as part of a physical exam. What are the symptoms of sleep apnea? Snoring. Waking up often at night. Daytime sleepiness. Pauses in breathing. Choking or gasping during sleep. Being annoyed easily. Forgetfulness. Trouble thinking clearly. Depression. Personality changes. Headaches in the  morning. Most people with sleep apnea do not know that they have it. What are the advantages of sleep apnea screening? Getting screened for sleep apnea can help: Keep you safer. Your providers need to know whether or not you have sleep apnea, especially if you're having surgery or have other long-term, or chronic, health conditions. Improve your health and help you get a better night's rest. Restful sleep can help you: Have more energy. Lose weight. Improve high blood pressure. Improve diabetes management. Prevent stroke. Prevent car accidents. What happens before the screening? You may talk with your provider about the screening and what other tests may be recommended based on the screening. What happens during the screening? Screening usually includes being asked a list of questions about your sleep quality. Some questions you may be asked include: Do you snore? Is your sleep restless? Do you have daytime sleepiness? Has a partner or spouse told you that you stop breathing, choke, or gasp during sleep? Have you had trouble concentrating or memory loss? What is your age? What is your neck circumference? To measure your neck, keep your back straight and gently wrap the tape measure around your neck. Put the tape measure at the middle of your neck, between your chin and collarbone. What is your sex assigned at birth? Do you have high blood pressure or are you being treated for high blood pressure? If your screening test is positive, you're at risk for the condition. More tests may be needed to confirm a diagnosis of sleep apnea. What can I expect after the screening? Your provider will go over the results of the screening with you and make recommendations based on the results of the test. Where to find more information You can find screening tools online or at your health care clinic. To learn more, go to these websites: Centers for Disease Control and Prevention: DiningCalendar.de. Then: Click  Health Topics A-Z. Type "sleep apnea" in the search box. National Heart, Lung, and Blood Institute: BuffaloDryCleaner.gl Contact a health care provider if: You think that you may have sleep apnea. This information is not intended to replace advice given to you by your health care provider. Make sure you discuss any questions you have with your health care provider. Document Revised: 09/01/2022 Document Reviewed: 09/01/2022 Elsevier Patient Education  2024 [employer].

## 2023-09-16 NOTE — Progress Notes (Addendum)
 SLEEP MEDICINE CLINIC    Provider:  Neomia Banner, MD  Primary Care Physician:  Cook, Jayce G, DO [address] [place] [address]     Referring Provider: Eduardo Grade, [place]. [address] [place],  [place] [address]          Chief Complaint according to patient   Patient presents with:     New Patient (Initial Visit)           HISTORY OF PRESENT ILLNESS:  Marcus [name] is a 73 y.o. male patient who is seen upon referral on 09/16/2023 from Dr Orvis Blare  for a  sleep consult :  Chief concern according to patient :     I have the pleasure of seeing Marcus [name] 09/16/23 a right-handed male with a possible sleep disorder.   Sleep relevant medical history: Nocturia/ 3 times, Sleep walking as adult,  he sleeps in another room,  sleep talking a lot- no Tonsillectomy, no TBI.  He reports sleeping on his left side and sleeps well, GERD better since he sleeps elevated he has hearing loss since his 47's , he played in a band , percussionist.     Family medical /sleep history: no  other family member on CPAP with OSA, insomnia, sleep walkers.    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  All states.  Tobacco use; none .   ETOH use ; none,  Caffeine intake in form of Coffee( decaff) Soda( decaff) Tea ( decaff) no energy drinks Exercise in form of walking .   Hobbies :cars.       Sleep habits are as follows:  The patient's dinner time is between 4 PM. The patient goes to bed at 9 PM and  usually watches TV in the den - continues to sleep for 7 hours, wakes for 3 bathroom breaks, the first time at 2 AM.   Bedroom is cool, dark and quiet.  The preferred sleep position is left, with the support of 2 pillows. Dreams are reportedly frequent/vivid.  On Sundays s he may sleep 10 hours up to 7 AM.   The patient wakes up spontaneously- 4.30  AM  is the usual rise time. He reports mostly feeling refreshed or restored in AM, with symptoms such as dry mouth,  no morning headaches. Naps are taken infrequently, after lunch only - lasting from 15 to 30 minutes .   Review of Systems: Out of a complete 14 system review, the patient complains of only the following symptoms, and all other reviewed systems are negative.:  Fatigue, sleepiness , snoring, fragmented sleep, Insomnia, RLS, Nocturia    How likely are you to doze in the following situations: 0 = not likely, 1 = slight chance, 2 = moderate chance, 3 = high chance   Sitting and Reading? Watching Television? Sitting inactive in a public place (theater or meeting)? As a passenger in a car for an hour without a break? Lying down in the afternoon when circumstances permit? Sitting and talking to someone? Sitting quietly after lunch without alcohol? In a car, while stopped for a few minutes in traffic?   Total = 10/ 24 points   FSS endorsed at 14/ 63 points.   HEARING LOSS   Social History   Socioeconomic History   Marital status: Married  Spouse name: Not on file   Number of children: Not on file   Years of education: Not on file   Highest education level: 12th grade  Occupational History   Not on file  Tobacco Use   Smoking status: Never   Smokeless tobacco: Never  Vaping Use   Vaping status: Never Used  Substance and Sexual Activity   Alcohol use: No   Drug use: No   Sexual activity: Not on file  Other Topics Concern   Not on file  Social History Narrative   Not on file   Social Drivers of Health   Financial Resource Strain: Low Risk  (05/21/2023)   Overall Financial Resource Strain (CARDIA)    Difficulty of Paying Living Expenses: Not hard at all  Food Insecurity: No Food Insecurity (05/21/2023)   Hunger Vital Sign    Worried About Running Out of Food in the Last Year: Never true    Ran Out of Food in the Last Year: Never true  Transportation Needs: No  Transportation Needs (05/21/2023)   PRAPARE - Administrator, Civil Service (Medical): No    Lack of Transportation (Non-Medical): No  Physical Activity: Insufficiently Active (05/21/2023)   Exercise Vital Sign    Days of Exercise per Week: 3 days    Minutes of Exercise per Session: 20 min  Stress: Stress Concern Present (05/21/2023)   [employer] of Occupational Health - Occupational Stress Questionnaire    Feeling of Stress : To some extent  Social Connections: Moderately Integrated (05/21/2023)   Social Connection and Isolation Panel [NHANES]    Frequency of Communication with Friends and Family: More than three times a week    Frequency of Social Gatherings with Friends and Family: Once a week    Attends Religious Services: More than 4 times per year    Active Member of [employer] or Organizations: No    Attends Banker Meetings: Never    Marital Status: Married    Family History  Problem Relation Age of Onset   Parkinson's disease Mother    Congestive Heart Failure Father     Past Medical History:  Diagnosis Date   Hypertension     Past Surgical History:  Procedure Laterality Date   CHOLECYSTECTOMY     COLONOSCOPY     COLONOSCOPY N/A 08/21/2017   Procedure: COLONOSCOPY;  Surgeon: Suzette Espy, MD;  Location: [facility];  Service: Endoscopy;  Laterality: N/A;  10:30     Current Outpatient Medications on File Prior to Visit  Medication Sig Dispense Refill   hydrochlorothiazide  (HYDRODIURIL ) 25 MG tablet Take 1 tablet (25 mg total) by mouth daily. 90 tablet 3   losartan  (COZAAR ) 50 MG tablet Take 1 tablet (50 mg total) by mouth daily. 90 tablet 3   Multiple Vitamin (MULTIVITAMIN WITH MINERALS) TABS tablet Take 1 tablet by mouth daily.     pantoprazole  (PROTONIX ) 40 MG tablet TAKE 1 TABLET BY MOUTH EVERY DAY 90 tablet 1   tamsulosin  (FLOMAX ) 0.4 MG CAPS capsule Take 2 capsules (0.8 mg total) by mouth daily. 180 capsule 1   No current  facility-administered medications on file prior to visit.    No Known Allergies   DIAGNOSTIC DATA (LABS, IMAGING, TESTING) - I reviewed patient records, labs, notes, testing and imaging myself where available.  Lab Results  Component Value Date   WBC 9.2 07/31/2023   HGB 17.3 (H) 07/31/2023   HCT 50.1 07/31/2023   MCV 86.7 07/31/2023  PLT 193 07/31/2023      Component Value Date/Time   NA 143 07/16/2023 0849   K 3.8 07/16/2023 0849   CL 103 07/16/2023 0849   CO2 23 07/16/2023 0849   GLUCOSE 84 07/16/2023 0849   BUN 17 07/16/2023 0849   CREATININE 1.35 (H) 07/16/2023 0849   CALCIUM 10.1 07/16/2023 0849   PROT 6.3 07/16/2023 0849   ALBUMIN 4.0 07/16/2023 0849   AST 18 07/16/2023 0849   ALT 19 07/16/2023 0849   ALKPHOS 95 07/16/2023 0849   BILITOT 1.1 07/16/2023 0849   Lab Results  Component Value Date   CHOL 146 07/16/2023   HDL 44 07/16/2023   LDLCALC 79 07/16/2023   TRIG 128 07/16/2023   CHOLHDL 3.3 07/16/2023   No results found for: "HGBA1C" No results found for: "VITAMINB12" No results found for: "TSH"  PHYSICAL EXAM:  Today's Vitals   09/16/23 1004  BP: 117/75  Pulse: 74  Weight: 248 lb (112.5 kg)  Height: 6\' 2"  (1.88 m)   Body mass index is 31.84 kg/m.   Wt Readings from Last 3 Encounters:  09/16/23 248 lb (112.5 kg)  08/19/23 245 lb 13 oz (111.5 kg)  07/30/23 246 lb (111.6 kg)     Ht Readings from Last 3 Encounters:  09/16/23 6\' 2"  (1.88 m)  08/19/23 6\' 2"  (1.88 m)  07/30/23 6\' 2"  (1.88 m)      General: The patient is awake, alert and appears not in acute distress. The patient is well groomed. Head: Normocephalic, atraumatic. Neck is supple.  Mallampati 3,  neck circumference:18 inches . Nasal airflow  patent.  Retrognathia is seen.  Dental status: CRSSBITE - severe , TMJ on the left  Cardiovascular:  Regular rate and cardiac rhythm by pulse,  without distended neck veins. Respiratory: Lungs are clear to auscultation.  Skin:  Without  evidence of ankle edema, or rash. Trunk: The patient's posture is erect.   NEUROLOGIC EXAM: The patient is awake and alert, oriented to place and time.   Memory subjective described as intact.  Attention span & concentration ability appears normal.  Speech is fluent,  without  dysarthria, dysphonia or aphasia.  Mood and affect are appropriate.   Cranial nerves: no loss of smell or taste reported  Pupils are equal and briskly reactive to light. Funduscopic exam deferred.  Extraocular movements in vertical and horizontal planes were intact and without nystagmus. No Diplopia. Visual fields by finger perimetry are intact. Hearing = impaired   Facial sensation intact to fine touch.  Facial motor strength is symmetric and tongue and uvula move midline.  Neck ROM : rotation, tilt and flexion extension were normal for age and shoulder shrug was symmetrical.    Motor exam:  Symmetric bulk, tone and ROM.   Normal tone without cog wheeling, symmetrically reduced  grip strength .   Sensory:  Fine touch and vibration were normal.  Proprioception tested in the upper extremities was normal.   Coordination: Rapid alternating movements in the fingers/hands were of normal speed.  The Finger-to-nose maneuver : evidence of ataxia, dysmetria and action tremor.   Gait and station: Patient could rise unassisted from a seated position, walked without assistive device.  Stance is of normal width/ base and the patient turned with 3 steps.  Toe and heel walk were deferred.  Deep tendon reflexes: in the  upper and lower extremities are symmetric and intact.  Babinski response was deferred/     ASSESSMENT AND PLAN 73 y.o. year  old male  here with: Alvira Josephs 73 y.o. male referred for polycythemia.  November 2024  first time abnormal hemoglobin.   He has no symptoms such as itching after hot showers or facial plethora, fever, chills, night sweats, weight loss, and loss of appetite.  He also denies  having sleep apnea but reports heavy snoring and occasional daytime sleepiness, especially while watching TV.  He goes to bed early around 9 PM and is up at 4.30, no alarm needed.  He has been acting out dreams, has been sleep talking and supposingly sleep walking.  He does not fall asleep while driving.   Epworth score and fatigue score were low.  He uses 2 pillows to sleep due to severe acid reflux and sleeps on his left .he has had a gallbladder surgery over a decade ago.    He has a history of "asbestosis" affecting the lower part of his right lung and undergoes annual breathing tests and CT scans.  Recently, a new doctor suggested possible asthma contributing to his shortness of breath. He experiences shortness of breath with quick activities and in humid conditions.   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 He is retired and lives with his wife. No significant weight loss or loss of appetite, although he feels full after small meals. No family history of cancer.   Patient has no history of smoking or alcohol use.   I have also reviewed the past medical history, past surgical history, social history and family history with the patient     HERE FOR :  secondary  polycythemia   Other  findings: strong essential tremor.  Hearing loss,  pulmonary disease.   Plan to evaluate for  sleep apnea, hypoxia and he wants to have a HST.   If there is hypoxia but not apnea, will defer to pulmonology.  If there is apnea with hypoxia, will treat-   If his sleep behaviour accelerates, I will need him to commit to a return for an in - in lab PSG .    Here primarily to look at sleep related causes for  high hematocrit and Hemoglobin, secondarily to  Hypoxia ?   Already was evaluated and ruled out :to medication. Has "asbestosis- like" Lung disease,  No AVM known, never been on  testosterone supplements.     I plan to follow up either  personally or through our NP within 4-5 months.   I would like to thank Cook, Jayce G, DO and [place], [place], [place] [address] [place],  [place] [address] for allowing me to meet with and to take care of this pleasant patient.     After spending a total time of  45  minutes face to face and additional time for physical and neurologic examination, review of laboratory studies,  personal review of imaging studies, reports and results of other testing and review of referral information / records as far as provided in visit,   Electronically signed by: Neomia Banner, MD 09/16/2023 10:35 AM  [facility] Board certified by The [employer] of Sleep Medicine and Diplomate of the [employer] of Sleep Medicine. Board certified In Neurology through the ABPN, Fellow of the [employer] of Neurology.

## 2023-10-01 ENCOUNTER — Ambulatory Visit: Admitting: Neurology

## 2023-10-01 DIAGNOSIS — G4733 Obstructive sleep apnea (adult) (pediatric): Secondary | ICD-10-CM | POA: Diagnosis not present

## 2023-10-01 DIAGNOSIS — G478 Other sleep disorders: Secondary | ICD-10-CM

## 2023-10-01 DIAGNOSIS — R918 Other nonspecific abnormal finding of lung field: Secondary | ICD-10-CM

## 2023-10-01 DIAGNOSIS — D582 Other hemoglobinopathies: Secondary | ICD-10-CM

## 2023-10-02 NOTE — Progress Notes (Signed)
 [facility]  Marcus [name] 73 year old male [birth year]   HOME SLEEP TEST REPORT ( by Watch PAT)   STUDY DATE:  10-01-2023      ORDERING CLINICIAN: Dedra Gores, MD  REFERRING CLINICIAN:  Dr Davonna Siad , MD, PCP ;  Dr Bluford   CLINICAL INFORMATION/HISTORY: This patient is a 73 year old retired gentleman reporting a moderate elevation of daytime sleepiness but no fatigue, he is referred by his hematologist to further evaluate for an abnormal high hemoglobin level.  This polycythemia globally anemia finding occurred in November 2024.  The patient had no symptoms is not a itching nor is he flushed, he does not have fevers no chills or night sweats no appetite loss no weight loss no shortness of breath.  The patient is referred to rule out that hypoxemia at night in form of sleep apnea or without sleep apnea could be a contributor to the elevated hemoglobin.  He notes that he snores and he has been reportedly acting out dreams.  He has a history of severe acid reflux disease.  The patient mentioned that he was diagnosed with asbestos related lung disease.     Epworth sleepiness score: 10/ 24 points   FSS endorsed at 14/ 63 points.    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   FINDINGS:   Sleep Summary:   Total Recording Time (hours, min):    7 hours 16 minutes   Total Sleep Time (hours, min): 6 hours 5 minutes               Percent REM (%):    8.7%                                    Respiratory Indices:   Calculated pAHI (per CMS guideline): 15.9/h                         REM pAHI:   27/h                                              NREM pAHI: 15/h                            Positional AHI:   This patient slept almost equal amounts of time in supine, and in right and left lateral position.  His nonsupine AHI was 12.1/h and his supine AHI 15.6/h.      As to Snoring: This patient had a mean volume of snoring at 45 dB which is moderate to loud, and snoring was present for over 60% of the recorded time, reaching a peak of above 60 dB.                                               Oxygen Saturation Statistics:   Oxygen Saturation (%) Mean: 91%   , with 78 hypoxia spells  O2 Saturation Range (%):   Between 84 and 96%                                    O2 Saturation (minutes) <89%:    18.7 minutes the equivalent of 5% of total sleep time.       Pulse Rate Statistics:   Pulse Mean (bpm): 75 bpm               Pulse Range:    Between 49 and 97 bpm             IMPRESSION:  This HST confirms the presence of moderate sleep apnea when applying CMS criteria, there is clearly a REM sleep dominance noted, there is also mild to moderate hypoxia recorded.  The heart rate varied throughout the night, and the lowest oxygen saturation was noted during left lateral sleep which should be avoided.   RECOMMENDATION:  #1  I would treat this patient with positive airway pressure therapy; 1)  because this patient has a REM dominant form of apnea, 2) he does have mild to moderate hypoxia which could contribute to erythrocytosis and he should avoid sleeping on his left. Treatment of acid reflux is also important to reduce apnea and pseudo apnea.  Plan : I will order an auto titration CPAP device for this patient with a setting between 5 and 15 cm water pressure with 3 cm EPR, heated humidification and an interface of his choice most likely a fullface mask due to the loud snoring.      INTERPRETING PHYSICIAN:  Dedra Gores, MD  [facility] and [facility] Sleep Board certified by The [employer] of Sleep Medicine and Diplomate of the [employer] of Sleep Medicine. Board certified In Neurology through the ABPN, Fellow of the [employer] of Neurology.

## 2023-10-09 ENCOUNTER — Encounter: Payer: Self-pay | Admitting: Family Medicine

## 2023-10-09 ENCOUNTER — Ambulatory Visit (INDEPENDENT_AMBULATORY_CARE_PROVIDER_SITE_OTHER): Admitting: Family Medicine

## 2023-10-09 VITALS — BP 134/67 | HR 79 | Temp 98.6°F | Ht 74.0 in | Wt 247.0 lb

## 2023-10-09 DIAGNOSIS — R52 Pain, unspecified: Secondary | ICD-10-CM | POA: Diagnosis not present

## 2023-10-09 DIAGNOSIS — B349 Viral infection, unspecified: Secondary | ICD-10-CM

## 2023-10-09 NOTE — Patient Instructions (Signed)
 Labs today.  Stay hydrated.  Take care  Dr. Bluford

## 2023-10-10 DIAGNOSIS — R52 Pain, unspecified: Secondary | ICD-10-CM | POA: Insufficient documentation

## 2023-10-10 DIAGNOSIS — B349 Viral infection, unspecified: Secondary | ICD-10-CM | POA: Insufficient documentation

## 2023-10-10 NOTE — Assessment & Plan Note (Addendum)
 Likely viral illness.  Obtaining labs for further evaluation to ensure no underlying dehydration, electrolyte disturbance, leukocytosis, etc. Supportive care.

## 2023-10-10 NOTE — Progress Notes (Signed)
 Subjective:  Patient ID: Marcus [name], male    DOB: July 04, 1950  Age: 73 y.o. MRN: [id number]  CC:   Chief Complaint  Patient presents with   Muscle Pain    Monday mowing the lawn got too hot and dehydrated. Tuesday chills, sweating, bones aching. Today feels better. Concern for virus or possible heat exhaustion    HPI:  73 year old male presents for evaluation of above.  Patient reports that on Monday he mowed his lawn and felt hot and possibly dehydrated.  Subsequently on Tuesday he developed chills body aches and subjective fever.  Patient states that he feels better today but he is still concerned about his symptoms.  He believes that his symptoms are either from the heat or due to a viral illness.  He states that his right has recently been sick with GI symptoms.  He has not had any GI symptoms.  Patient Active Problem List   Diagnosis Date Noted   Viral illness 10/10/2023   Body aches 10/10/2023   Pulmonary infiltrate on radiologic exam 09/16/2023   Sleep talking 09/16/2023   Elevated hemoglobin (HCC) 07/30/2023   Hypertension 08/01/2022   BPH (benign prostatic hyperplasia) 08/01/2022   GERD (gastroesophageal reflux disease) 08/01/2022   CKD (chronic kidney disease) 08/01/2022    Social Hx   Social History   Socioeconomic History   Marital status: Married    Spouse name: Not on file   Number of children: Not on file   Years of education: Not on file   Highest education level: 12th grade  Occupational History   Not on file  Tobacco Use   Smoking status: Never   Smokeless tobacco: Never  Vaping Use   Vaping status: Never Used  Substance and Sexual Activity   Alcohol use: No   Drug use: No   Sexual activity: Not on file  Other Topics Concern   Not on file  Social History Narrative   Not on file   Social Drivers of Health   Financial Resource Strain: Low Risk  (05/21/2023)   Overall Financial Resource Strain (CARDIA)    Difficulty of Paying Living  Expenses: Not hard at all  Food Insecurity: No Food Insecurity (05/21/2023)   Hunger Vital Sign    Worried About Running Out of Food in the Last Year: Never true    Ran Out of Food in the Last Year: Never true  Transportation Needs: No Transportation Needs (05/21/2023)   PRAPARE - Administrator, Civil Service (Medical): No    Lack of Transportation (Non-Medical): No  Physical Activity: Insufficiently Active (05/21/2023)   Exercise Vital Sign    Days of Exercise per Week: 3 days    Minutes of Exercise per Session: 20 min  Stress: Stress Concern Present (05/21/2023)   [employer] of Occupational Health - Occupational Stress Questionnaire    Feeling of Stress : To some extent  Social Connections: Moderately Integrated (05/21/2023)   Social Connection and Isolation Panel    Frequency of Communication with Friends and Family: More than three times a week    Frequency of Social Gatherings with Friends and Family: Once a week    Attends Religious Services: More than 4 times per year    Active Member of [employer] or Organizations: No    Attends Banker Meetings: Never    Marital Status: Married    Review of Systems Per HPI  Objective:  BP 134/67   Pulse 79   Temp 98.6  F (37 C)   Ht 6' 2 (1.88 m)   Wt 247 lb (112 kg)   SpO2 94%   BMI 31.71 kg/m      10/09/2023    9:46 AM 09/16/2023   10:04 AM 08/19/2023    8:12 AM  BP/Weight  Systolic BP 134 117 121  Diastolic BP 67 75 77  Wt. (Lbs) 247 248 245.81  BMI 31.71 kg/m2 31.84 kg/m2 31.56 kg/m2    Physical Exam Vitals and nursing note reviewed.  Constitutional:      General: He is not in acute distress.    Appearance: Normal appearance.  HENT:     Head: Normocephalic and atraumatic.  Eyes:     General:        Right eye: No discharge.        Left eye: No discharge.     Conjunctiva/sclera: Conjunctivae normal.  Cardiovascular:     Rate and Rhythm: Normal rate and regular rhythm.  Pulmonary:      Effort: Pulmonary effort is normal.     Breath sounds: Normal breath sounds. No wheezing, rhonchi or rales.  Neurological:     Mental Status: He is alert.  Psychiatric:        Mood and Affect: Mood normal.        Behavior: Behavior normal.     Lab Results  Component Value Date   WBC 9.2 07/31/2023   HGB 17.3 (H) 07/31/2023   HCT 50.1 07/31/2023   PLT 193 07/31/2023   GLUCOSE 84 07/16/2023   CHOL 146 07/16/2023   TRIG 128 07/16/2023   HDL 44 07/16/2023   LDLCALC 79 07/16/2023   ALT 19 07/16/2023   AST 18 07/16/2023   NA 143 07/16/2023   K 3.8 07/16/2023   CL 103 07/16/2023   CREATININE 1.35 (H) 07/16/2023   BUN 17 07/16/2023   CO2 23 07/16/2023     Assessment & Plan:  Body aches Assessment & Plan: Likely viral illness.  Obtaining labs for further evaluation to ensure no underlying dehydration, electrolyte disturbance, leukocytosis, etc. Supportive care.  Orders: -     CMP14+EGFR -     CBC with Differential/Platelet  Viral illness    Follow-up:  Return if symptoms worsen or fail to improve.  Jacqulyn Ahle DO [facility]

## 2023-10-17 ENCOUNTER — Ambulatory Visit: Payer: Self-pay | Admitting: Neurology

## 2023-10-17 ENCOUNTER — Other Ambulatory Visit: Payer: Self-pay | Admitting: Neurology

## 2023-10-17 DIAGNOSIS — G4734 Idiopathic sleep related nonobstructive alveolar hypoventilation: Secondary | ICD-10-CM | POA: Insufficient documentation

## 2023-10-17 DIAGNOSIS — D582 Other hemoglobinopathies: Secondary | ICD-10-CM

## 2023-10-17 DIAGNOSIS — G4733 Obstructive sleep apnea (adult) (pediatric): Secondary | ICD-10-CM | POA: Insufficient documentation

## 2023-10-17 DIAGNOSIS — R0683 Snoring: Secondary | ICD-10-CM | POA: Insufficient documentation

## 2023-10-17 NOTE — Procedures (Signed)
 [facility]  Marcus [name] 73 year old male [birth year]   HOME SLEEP TEST REPORT ( by Watch PAT)   STUDY DATE:  10-01-2023      ORDERING CLINICIAN: Dedra Gores, MD  REFERRING CLINICIAN:  Dr Davonna Siad , MD, PCP ;  Dr Bluford   CLINICAL INFORMATION/HISTORY: This patient is a 73 year old retired gentleman reporting a moderate elevation of daytime sleepiness but no fatigue, he is referred by his hematologist to further evaluate for an abnormal high hemoglobin level.  This polycythemia globally anemia finding occurred in November 2024.  The patient had no symptoms is not a itching nor is he flushed, he does not have fevers no chills or night sweats no appetite loss no weight loss no shortness of breath.  The patient is referred to rule out that hypoxemia at night in form of sleep apnea or without sleep apnea could be a contributor to the elevated hemoglobin.  He notes that he snores and he has been reportedly acting out dreams.  He has a history of severe acid reflux disease.  The patient mentioned that he was diagnosed with asbestos related lung disease.     Epworth sleepiness score: 10/ 24 points   FSS endorsed at 14/ 63 points.    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   FINDINGS:   Sleep Summary:   Total Recording Time (hours, min):    7 hours 16 minutes   Total Sleep Time (hours, min): 6 hours 5 minutes               Percent REM (%):    8.7%                                    Respiratory Indices:   Calculated pAHI (per CMS guideline): 15.9/h                         REM pAHI:   27/h                                              NREM pAHI: 15/h                            Positional AHI:   This patient slept almost equal amounts of time in supine, and in right and left lateral position.  His nonsupine AHI was 12.1/h and his supine AHI 15.6/h.      As to Snoring: This patient had a mean volume of snoring at 45 dB which is moderate to loud, and snoring was present for over 60% of the recorded time, reaching a peak of above 60 dB.                                               Oxygen Saturation Statistics:   Oxygen Saturation (%) Mean: 91%   , with 78 hypoxia spells  O2 Saturation Range (%):   Between 84 and 96%                                    O2 Saturation (minutes) <89%:    18.7 minutes the equivalent of 5% of total sleep time.       Pulse Rate Statistics:   Pulse Mean (bpm): 75 bpm               Pulse Range:    Between 49 and 97 bpm             IMPRESSION:  This HST confirms the presence of moderate sleep apnea when applying CMS criteria, there is clearly a REM sleep dominance noted, there is also mild to moderate hypoxia recorded.  The heart rate varied throughout the night, and the lowest oxygen saturation was noted during left lateral sleep which should be avoided.   RECOMMENDATION:  #1  I would treat this patient with positive airway pressure therapy; 1)  because this patient has a REM dominant form of apnea, 2) he does have mild to moderate hypoxia which could contribute to erythrocytosis and he should avoid sleeping on his left. Treatment of acid reflux is also important to reduce apnea and pseudo apnea.  Plan : I will order an auto titration CPAP device for this patient with a setting between 5 and 15 cm water pressure with 3 cm EPR, heated humidification and an interface of his choice most likely a fullface mask due to the loud snoring.      INTERPRETING PHYSICIAN:  Dedra Gores, MD  [facility] and [facility] Sleep Board certified by The [employer] of Sleep Medicine and Diplomate of the [employer] of Sleep Medicine. Board certified In Neurology through the ABPN, Fellow of the [employer] of Neu[employer].

## 2023-10-23 ENCOUNTER — Other Ambulatory Visit: Payer: Self-pay | Admitting: Family Medicine

## 2023-10-28 DIAGNOSIS — H6123 Impacted cerumen, bilateral: Secondary | ICD-10-CM | POA: Diagnosis not present

## 2023-10-28 NOTE — Telephone Encounter (Signed)
 Chino Sardo D, CMA  Joylene Bradley; Cain, Mitchell; Garcia, Patricia; Ziegler, Melissa; Tucker, Dolanda New orders have been placed for the above pt, DOB:10/13/1950 Thanks

## 2023-11-05 ENCOUNTER — Other Ambulatory Visit: Payer: Self-pay

## 2023-11-05 ENCOUNTER — Encounter: Payer: Self-pay | Admitting: Family Medicine

## 2023-11-05 DIAGNOSIS — I10 Essential (primary) hypertension: Secondary | ICD-10-CM

## 2023-11-05 MED ORDER — PANTOPRAZOLE SODIUM 40 MG PO TBEC: 40.0000 mg | DELAYED_RELEASE_TABLET | Freq: Every day | ORAL | 3 refills | Status: DC

## 2023-11-05 MED ORDER — TAMSULOSIN HCL 0.4 MG PO CAPS: 0.4000 mg | ORAL_CAPSULE | Freq: Every day | ORAL | 3 refills | Status: DC

## 2023-11-05 MED ORDER — HYDROCHLOROTHIAZIDE 25 MG PO TABS
25.0000 mg | ORAL_TABLET | Freq: Every day | ORAL | 3 refills | Status: DC
Start: 2023-11-05 — End: 2023-11-13

## 2023-11-13 ENCOUNTER — Other Ambulatory Visit: Payer: Self-pay

## 2023-11-13 ENCOUNTER — Telehealth: Payer: Self-pay

## 2023-11-13 ENCOUNTER — Inpatient Hospital Stay: Attending: Oncology

## 2023-11-13 DIAGNOSIS — R0902 Hypoxemia: Secondary | ICD-10-CM | POA: Insufficient documentation

## 2023-11-13 DIAGNOSIS — Z79899 Other long term (current) drug therapy: Secondary | ICD-10-CM | POA: Diagnosis not present

## 2023-11-13 DIAGNOSIS — I10 Essential (primary) hypertension: Secondary | ICD-10-CM

## 2023-11-13 DIAGNOSIS — D751 Secondary polycythemia: Secondary | ICD-10-CM | POA: Insufficient documentation

## 2023-11-13 DIAGNOSIS — G4730 Sleep apnea, unspecified: Secondary | ICD-10-CM | POA: Diagnosis not present

## 2023-11-13 LAB — CBC WITH DIFFERENTIAL/PLATELET
Abs Immature Granulocytes: 0.08 K/uL — ABNORMAL HIGH (ref 0.00–0.07)
Basophils Absolute: 0.1 K/uL (ref 0.0–0.1)
Basophils Relative: 1 %
Eosinophils Absolute: 0.3 K/uL (ref 0.0–0.5)
Eosinophils Relative: 4 %
HCT: 50 % (ref 39.0–52.0)
Hemoglobin: 17.8 g/dL — ABNORMAL HIGH (ref 13.0–17.0)
Immature Granulocytes: 1 %
Lymphocytes Relative: 22 %
Lymphs Abs: 2 K/uL (ref 0.7–4.0)
MCH: 30.8 pg (ref 26.0–34.0)
MCHC: 35.6 g/dL (ref 30.0–36.0)
MCV: 86.7 fL (ref 80.0–100.0)
Monocytes Absolute: 1.1 K/uL — ABNORMAL HIGH (ref 0.1–1.0)
Monocytes Relative: 12 %
Neutro Abs: 5.6 K/uL (ref 1.7–7.7)
Neutrophils Relative %: 60 %
Platelets: 187 K/uL (ref 150–400)
RBC: 5.77 MIL/uL (ref 4.22–5.81)
RDW: 14.1 % (ref 11.5–15.5)
WBC: 9.2 K/uL (ref 4.0–10.5)
nRBC: 0 % (ref 0.0–0.2)

## 2023-11-13 LAB — COMPREHENSIVE METABOLIC PANEL WITH GFR
ALT: 18 U/L (ref 0–44)
AST: 19 U/L (ref 15–41)
Albumin: 3.7 g/dL (ref 3.5–5.0)
Alkaline Phosphatase: 80 U/L (ref 38–126)
Anion gap: 9 (ref 5–15)
BUN: 19 mg/dL (ref 8–23)
CO2: 23 mmol/L (ref 22–32)
Calcium: 9.8 mg/dL (ref 8.9–10.3)
Chloride: 105 mmol/L (ref 98–111)
Creatinine, Ser: 1.21 mg/dL (ref 0.61–1.24)
GFR, Estimated: >60 mL/min (ref >60)
Glucose, Bld: 93 mg/dL (ref 70–99)
Potassium: 3.4 mmol/L — ABNORMAL LOW (ref 3.5–5.1)
Sodium: 137 mmol/L (ref 135–145)
Total Bilirubin: 1.2 mg/dL (ref 0.0–1.2)
Total Protein: 6.6 g/dL (ref 6.5–8.1)

## 2023-11-13 MED ORDER — PANTOPRAZOLE SODIUM 40 MG PO TBEC: 40.0000 mg | DELAYED_RELEASE_TABLET | Freq: Every day | ORAL | 3 refills | Status: AC

## 2023-11-13 MED ORDER — LOSARTAN POTASSIUM 50 MG PO TABS: 50.0000 mg | ORAL_TABLET | Freq: Every day | ORAL | 3 refills | Status: AC

## 2023-11-13 MED ORDER — HYDROCHLOROTHIAZIDE 25 MG PO TABS: 25.0000 mg | ORAL_TABLET | Freq: Every day | ORAL | 3 refills | Status: AC

## 2023-11-13 MED ORDER — TAMSULOSIN HCL 0.4 MG PO CAPS: 0.4000 mg | ORAL_CAPSULE | Freq: Every day | ORAL | 3 refills | Status: DC

## 2023-11-13 NOTE — Telephone Encounter (Signed)
 Prescription Request  11/13/2023  LOV: Visit date not found  What is the name of the medication or equipment? tamsulosin  (FLOMAX ) 0.4 MG CAPS capsule losartan  (COZAAR ) 50 MG tablet hydrochlorothiazide  (HYDRODIURIL ) 25 MG tablet pantoprazole  (PROTONIX ) 40 MG tablet   Have you contacted your pharmacy to request a refill? Yes   Which pharmacy would you like this sent to?  [id number] [place], [place] - [address] [place] [address] Phone: [phone] Fax: [phone]    Patient notified that their request is being sent to the clinical staff for review and that they should receive a response within 2 business days.   Please advise at Mobile [phone] (mobile)

## 2023-11-18 DIAGNOSIS — D225 Melanocytic nevi of trunk: Secondary | ICD-10-CM | POA: Diagnosis not present

## 2023-11-18 DIAGNOSIS — X32XXXD Exposure to sunlight, subsequent encounter: Secondary | ICD-10-CM | POA: Diagnosis not present

## 2023-11-18 DIAGNOSIS — Z85820 Personal history of malignant melanoma of skin: Secondary | ICD-10-CM | POA: Diagnosis not present

## 2023-11-18 DIAGNOSIS — Z08 Encounter for follow-up examination after completed treatment for malignant neoplasm: Secondary | ICD-10-CM | POA: Diagnosis not present

## 2023-11-18 DIAGNOSIS — Z1283 Encounter for screening for malignant neoplasm of skin: Secondary | ICD-10-CM | POA: Diagnosis not present

## 2023-11-18 DIAGNOSIS — L570 Actinic keratosis: Secondary | ICD-10-CM | POA: Diagnosis not present

## 2023-11-19 DIAGNOSIS — D751 Secondary polycythemia: Secondary | ICD-10-CM | POA: Insufficient documentation

## 2023-11-19 NOTE — Assessment & Plan Note (Addendum)
 Patient likely has secondary polycythemia from sleep apnea Less likely from the benign renal cyst.  No history of smoking or alcohol use EPO: normal, indicating secondary etiology for polycythemia JAK2/MPL/CALR/E 12-15: Negative Ultrasound abdomen: Showed some hepatic echotexture but no cyst in liver.  Benign cyst of 1.3 cm and left kidney. Sleep study : Mild to moderate sleep apnea causing moderate hypoxia   - No indication for phlebotomy at this time.  Repeat labs actually showed improvement in hemoglobin and hematocrit - Recommended to be consistent with CPAP use   Return to clinic in 6 months with labs.

## 2023-11-19 NOTE — Progress Notes (Signed)
 Marcus Marcus [name] at [name]  HEMATOLOGY FOLLOW-UP VISIT  Marcus Jacqulyn MATSU, DO  REASON FOR FOLLOW-UP: Secondary polycythemia  ASSESSMENT & PLAN:  Patient is a 73 y.o. Marcus [name] following for secondary polycythemia  Assessment & Plan Polycythemia, secondary Patient likely has secondary polycythemia from sleep apnea Less likely from the benign renal cyst.  No history of smoking or alcohol use EPO: normal, indicating secondary etiology for polycythemia JAK2/MPL/CALR/E 12-15: Negative Ultrasound abdomen: Showed some hepatic echotexture but no cyst in liver.  Benign cyst of 1.3 cm and left kidney. Sleep study : Mild to moderate sleep apnea causing moderate hypoxia   - No indication for phlebotomy at this time.  Repeat labs actually showed improvement in hemoglobin and hematocrit - Recommended to be consistent with CPAP use   Return to clinic in 6 months with labs.   Orders Placed This Encounter  Procedures   CBC    Standing Status:   Future    Expected Date:   05/22/2024    Expiration Date:   11/19/2024    The total time spent in the appointment was 20 minutes encounter with patients including review of chart and various tests results, discussions about plan of care and coordination of care plan   All questions were answered. The patient knows to call the clinic with any problems, questions or concerns. No barriers to learning was detected.  Marcus Dry, MD 8/13/202510:10 AM   SUMMARY OF HEMATOLOGIC HISTORY: Secondary polycythemia -EPO: Normal -JAK2/MPL/CALR/E 12-15: Negative -Ultrasound abdomen: Showed some hepatic echotexture but no cyst in liver.  Benign cyst of 1.3 cm in the left kidney. -Sleep study evaluation: Mild to moderate sleep apnea causing moderate hypoxia   INTERVAL HISTORY: Marcus Marcus [name] 73 y.o. Marcus [name] following for polycythemia.  Patient has no complaints today.  He reported that he was recently diagnosed of sleep apnea and just got his  CPAP machine today.  He denies shortness of breath, loss of weight, fever, chills, abdominal pain.  He reported that he has improved his sleep habits at this time.   I have reviewed the past medical history, past surgical history, social history and family history with the patient   ALLERGIES:  has no known allergies.  MEDICATIONS:  Current Outpatient Medications  Medication Sig Dispense Refill   hydrochlorothiazide  (HYDRODIURIL ) 25 MG tablet Take 1 tablet (25 mg total) by mouth daily. 90 tablet 3   losartan  (COZAAR ) 50 MG tablet Take 1 tablet (50 mg total) by mouth daily. 90 tablet 3   Multiple Vitamin (MULTIVITAMIN WITH MINERALS) TABS tablet Take 1 tablet by mouth daily.     pantoprazole  (PROTONIX ) 40 MG tablet Take 1 tablet (40 mg total) by mouth daily. 90 tablet 3   tamsulosin  (FLOMAX ) 0.4 MG CAPS capsule Take 1 capsule (0.4 mg total) by mouth daily. 90 capsule 3   No current facility-administered medications for this visit.     REVIEW OF SYSTEMS:   Constitutional: Denies fevers, chills or night sweats Eyes: Denies blurriness of vision Ears, nose, mouth, throat, and face: Denies mucositis or sore throat Respiratory: Denies cough, dyspnea or wheezes Cardiovascular: Denies palpitation, chest discomfort or lower extremity swelling Gastrointestinal:  Denies nausea, heartburn or change in bowel habits Skin: Denies abnormal skin rashes Lymphatics: Denies new lymphadenopathy or easy bruising Neurological:Denies numbness, tingling or new weaknesses Behavioral/Psych: Mood is stable, no new changes  All other systems were reviewed with the patient and are negative.  PHYSICAL EXAMINATION:   Vitals:   11/20/23  0800  BP: 103/67  Pulse: 60  Resp: 20  Temp: (!) 97.4 F (36.3 C)  SpO2: 97%     GENERAL:alert, no distress and comfortable SKIN: skin color, texture, turgor are normal, no rashes or significant lesions LUNGS: clear to auscultation and percussion with normal breathing  effort HEART: regular rate & rhythm and no murmurs and no lower extremity edema ABDOMEN:abdomen soft, non-tender and normal bowel sounds Musculoskeletal:no cyanosis of digits and no clubbing  NEURO: alert & oriented x 3 with fluent speech  LABORATORY DATA:  I have reviewed the data as listed  Lab Results  Component Value Date   WBC 9.2 11/13/2023   NEUTROABS 5.6 11/13/2023   HGB 17.8 (H) 11/13/2023   HCT 50.0 11/13/2023   MCV 86.7 11/13/2023   PLT 187 11/13/2023      Chemistry      Component Value Date/Time   NA 137 11/13/2023 0817   NA 143 07/16/2023 0849   K 3.4 (L) 11/13/2023 0817   CL 105 11/13/2023 0817   CO2 23 11/13/2023 0817   BUN 19 11/13/2023 0817   BUN 17 07/16/2023 0849   CREATININE 1.21 11/13/2023 0817      Component Value Date/Time   CALCIUM 9.8 11/13/2023 0817   ALKPHOS 80 11/13/2023 0817   AST 19 11/13/2023 0817   ALT 18 11/13/2023 0817   BILITOT 1.2 11/13/2023 0817   BILITOT 1.1 07/16/2023 0849      Latest Reference Range & Units 07/31/23 10:51  Erythropoietin  2.6 - 18.5 mIU/mL 8.1    07/31/23 10:51  CALR +MPL + E12-E15  (REFLEX) Negative  JAK2 [id number] RFX CALR/MPL/E12-15 Negative    RADIOGRAPHIC STUDIES: I have personally reviewed the radiological reort as listed below  Home sleep test Dohmeier, Dedra, MD     10/17/2023  5:10 PM      [facility]  Marcus Marcus [name] [birth year]   HOME SLEEP TEST REPORT ( by Watch PAT)   STUDY DATE:  10-01-2023      ORDERING CLINICIAN: Dedra Gores, MD  REFERRING CLINICIAN:  Dr Marcus Marcus [name] , MD, PCP ;  Marcus Marcus [name]   CLINICAL INFORMATION/HISTORY: This patient is a 73 year old  retired gentleman reporting a moderate elevation of daytime  sleepiness but no fatigue, he is referred by his hematologist to  further evaluate for an abnormal high hemoglobin level.  This  polycythemia globally anemia finding occurred in November 2024.   The patient had no symptoms is not a itching  nor is he flushed,  he does not have fevers no chills or night sweats no appetite  loss no weight loss no shortness of breath.  The patient is  referred to rule out that hypoxemia at night in form of sleep  apnea or without sleep apnea could be a contributor to the  elevated hemoglobin.  He notes that he snores and he has been  reportedly acting out dreams.  He has a history of severe acid  reflux disease.  The patient mentioned that he was diagnosed with  asbestos related lung disease.    Epworth sleepiness score: 10/ 24 points   FSS endorsed at 14/ 63 points.    BMI: 31.8 kg/m   Neck Circumference: 18, please note that this patient has a  crossbite and retrognathia with TNG symptoms on the left jaw.   FINDINGS:   Sleep Summary:   Total Recording Time (hours, min):    7 hours 16 minutes  Total Sleep Time (hours, min): 6 hours 5 minutes               Percent REM (%):    8.7%                                    Respiratory Indices:   Calculated pAHI (per CMS guideline): 15.9/h                         REM pAHI:   27/h                                              NREM pAHI: 15/h                            Positional AHI:   This patient slept almost equal amounts of time  in supine, and in right and left lateral position.  His nonsupine  AHI was 12.1/h and his supine AHI 15.6/h.     As to Snoring: This patient had a mean volume of snoring at 45 dB  which is moderate to loud, and snoring was present for over 60%  of the recorded time, reaching a peak of above 60 dB.                                               Oxygen Saturation Statistics:   Oxygen Saturation (%) Mean: 91%   , with 78 hypoxia spells            O2 Saturation Range (%):   Between 84 and 96%                                     O2 Saturation (minutes) <89%:    18.7 minutes the equivalent of  5% of total sleep time.       Pulse Rate Statistics:   Pulse Mean (bpm): 75 bpm               Pulse  Range:    Between 49 and 97 bpm             IMPRESSION:  This HST confirms the presence of moderate sleep  apnea when applying CMS criteria, there is clearly a REM sleep  dominance noted, there is also mild to moderate hypoxia recorded.   The heart rate varied throughout the night, and the lowest  oxygen saturation was noted during left lateral sleep which  should be avoided.   RECOMMENDATION:  #1  I would treat this patient with positive airway pressure  therapy; 1)  because this patient has a REM dominant form of apnea, 2) he  does have mild to moderate hypoxia which could contribute to  erythrocytosis and he should avoid sleeping on his left. Treatment of acid reflux is also important to reduce apnea and  pseudo apnea.  Plan : I will order an auto titration CPAP device for this  patient with a setting between 5 and 15 cm water pressure with 3  cm EPR, heated humidification and an interface of his choice most  likely a fullface mask due to the loud snoring.    INTERPRETING PHYSICIAN:  Marcus Gores, MD  [facility] and [facility] Sleep Board certified by The [employer] of Sleep Medicine and  Diplomate of the [employer] of Sleep Medicine. Board certified In Neurology through the ABPN, Fellow of the  [employer] Neu[employer].

## 2023-11-20 ENCOUNTER — Inpatient Hospital Stay: Admitting: Oncology

## 2023-11-20 VITALS — BP 103/67 | HR 60 | Temp 97.4°F | Resp 20 | Wt 244.0 lb

## 2023-11-20 DIAGNOSIS — G4730 Sleep apnea, unspecified: Secondary | ICD-10-CM | POA: Diagnosis not present

## 2023-11-20 DIAGNOSIS — D751 Secondary polycythemia: Secondary | ICD-10-CM | POA: Diagnosis not present

## 2023-11-20 DIAGNOSIS — Z79899 Other long term (current) drug therapy: Secondary | ICD-10-CM | POA: Diagnosis not present

## 2023-11-20 DIAGNOSIS — R0902 Hypoxemia: Secondary | ICD-10-CM | POA: Diagnosis not present

## 2023-11-22 DIAGNOSIS — H43823 Vitreomacular adhesion, bilateral: Secondary | ICD-10-CM | POA: Diagnosis not present

## 2023-11-22 DIAGNOSIS — H0102B Squamous blepharitis left eye, upper and lower eyelids: Secondary | ICD-10-CM | POA: Diagnosis not present

## 2023-11-22 DIAGNOSIS — H31092 Other chorioretinal scars, left eye: Secondary | ICD-10-CM | POA: Diagnosis not present

## 2023-11-22 DIAGNOSIS — H0102A Squamous blepharitis right eye, upper and lower eyelids: Secondary | ICD-10-CM | POA: Diagnosis not present

## 2023-11-22 DIAGNOSIS — H2511 Age-related nuclear cataract, right eye: Secondary | ICD-10-CM | POA: Diagnosis not present

## 2023-11-22 DIAGNOSIS — H43393 Other vitreous opacities, bilateral: Secondary | ICD-10-CM | POA: Diagnosis not present

## 2023-11-22 DIAGNOSIS — L719 Rosacea, unspecified: Secondary | ICD-10-CM | POA: Diagnosis not present

## 2024-01-02 ENCOUNTER — Other Ambulatory Visit: Payer: Self-pay | Admitting: Family Medicine

## 2024-01-02 ENCOUNTER — Encounter: Payer: Self-pay | Admitting: Family Medicine

## 2024-01-02 ENCOUNTER — Ambulatory Visit: Payer: Self-pay | Admitting: Family Medicine

## 2024-01-02 LAB — CBC WITH DIFFERENTIAL/PLATELET
Basophils Absolute: 0.1 x10E3/uL (ref 0.0–0.2)
Basos: 1 %
EOS (ABSOLUTE): 0.3 x10E3/uL (ref 0.0–0.4)
Eos: 3 %
Hematocrit: 50.8 % (ref 37.5–51.0)
Hemoglobin: 17.2 g/dL (ref 13.0–17.7)
Immature Grans (Abs): 0.1 x10E3/uL (ref 0.0–0.1)
Immature Granulocytes: 1 %
Lymphocytes Absolute: 1.9 x10E3/uL (ref 0.7–3.1)
Lymphs: 21 %
MCH: 31 pg (ref 26.6–33.0)
MCHC: 33.9 g/dL (ref 31.5–35.7)
MCV: 92 fL (ref 79–97)
Monocytes Absolute: 1.1 x10E3/uL — ABNORMAL HIGH (ref 0.1–0.9)
Monocytes: 12 %
Neutrophils Absolute: 5.5 x10E3/uL (ref 1.4–7.0)
Neutrophils: 62 %
Platelets: 217 x10E3/uL (ref 150–450)
RBC: 5.55 x10E6/uL (ref 4.14–5.80)
RDW: 13.2 % (ref 11.6–15.4)
WBC: 8.9 x10E3/uL (ref 3.4–10.8)

## 2024-01-02 LAB — CMP14+EGFR
ALT: 13 IU/L (ref 0–44)
AST: 20 IU/L (ref 0–40)
Albumin: 3.8 g/dL (ref 3.8–4.8)
Alkaline Phosphatase: 101 IU/L (ref 47–123)
BUN/Creatinine Ratio: 17 (ref 10–24)
BUN: 23 mg/dL (ref 8–27)
Bilirubin Total: 0.7 mg/dL (ref 0.0–1.2)
CO2: 23 mmol/L (ref 20–29)
Calcium: 10 mg/dL (ref 8.6–10.2)
Chloride: 106 mmol/L (ref 96–106)
Creatinine, Ser: 1.37 mg/dL — ABNORMAL HIGH (ref 0.76–1.27)
Globulin, Total: 2.1 g/dL (ref 1.5–4.5)
Glucose: 73 mg/dL (ref 70–99)
Potassium: 3.8 mmol/L (ref 3.5–5.2)
Sodium: 142 mmol/L (ref 134–144)
Total Protein: 5.9 g/dL — ABNORMAL LOW (ref 6.0–8.5)
eGFR: 54 mL/min/1.73 — ABNORMAL LOW (ref >59)

## 2024-01-08 DIAGNOSIS — H6123 Impacted cerumen, bilateral: Secondary | ICD-10-CM | POA: Diagnosis not present

## 2024-01-13 ENCOUNTER — Other Ambulatory Visit: Payer: Self-pay | Admitting: Family Medicine

## 2024-01-29 ENCOUNTER — Ambulatory Visit: Admitting: Family Medicine

## 2024-01-29 VITALS — BP 128/67 | HR 97 | Temp 98.1°F | Ht 74.0 in | Wt 249.0 lb

## 2024-01-29 DIAGNOSIS — R351 Nocturia: Secondary | ICD-10-CM

## 2024-01-29 DIAGNOSIS — N401 Enlarged prostate with lower urinary tract symptoms: Secondary | ICD-10-CM | POA: Diagnosis not present

## 2024-01-29 DIAGNOSIS — N1831 Chronic kidney disease, stage 3a: Secondary | ICD-10-CM

## 2024-01-29 DIAGNOSIS — G4733 Obstructive sleep apnea (adult) (pediatric): Secondary | ICD-10-CM | POA: Diagnosis not present

## 2024-01-29 DIAGNOSIS — I10 Essential (primary) hypertension: Secondary | ICD-10-CM

## 2024-01-29 DIAGNOSIS — R7989 Other specified abnormal findings of blood chemistry: Secondary | ICD-10-CM | POA: Diagnosis not present

## 2024-01-29 MED ORDER — TAMSULOSIN HCL 0.4 MG PO CAPS: 0.8000 mg | ORAL_CAPSULE | Freq: Every day | ORAL | 3 refills | Status: DC

## 2024-01-29 NOTE — Assessment & Plan Note (Signed)
Doing well on CPAP.  Continue.

## 2024-01-29 NOTE — Progress Notes (Signed)
 Subjective:  Patient ID: Marcus [name], male    DOB: May 09, 1950  Age: 73 y.o. MRN: [id number]  CC: Follow-up   HPI:  73 year old male presents for follow-up  Hypertension stable on losartan  and HCTZ.  He is followed by neurology regarding obstructive sleep apnea.  Endorses compliance with CPAP.  Polycythemia has improved.  Patient reports that his neurologist was concerned about him having low testosterone.  He desires this to be checked today.  CKD stable.  BPH stable on Flomax .  Needs change in Rx as he is taking 2 capsules.  Desires flu vaccine today.  However, we do not have any additional high-dose flu vaccines.  He will follow-up next week for a nurse visit as we should have a another shipment and of vaccines.  Patient Active Problem List   Diagnosis Date Noted   Polycythemia, secondary 11/19/2023   Sleep related hypoxia 10/17/2023   Moderate obstructive sleep apnea-hypopnea syndrome 10/17/2023   Elevated hemoglobin 07/30/2023   Hypertension 08/01/2022   BPH (benign prostatic hyperplasia) 08/01/2022   GERD (gastroesophageal reflux disease) 08/01/2022   CKD (chronic kidney disease) 08/01/2022    Social Hx   Social History   Socioeconomic History   Marital status: Married    Spouse name: Not on file   Number of children: Not on file   Years of education: Not on file   Highest education level: 12th grade  Occupational History   Not on file  Tobacco Use   Smoking status: Never   Smokeless tobacco: Never  Vaping Use   Vaping status: Never Used  Substance and Sexual Activity   Alcohol use: No   Drug use: No   Sexual activity: Not on file  Other Topics Concern   Not on file  Social History Narrative   Not on file   Social Drivers of Health   Financial Resource Strain: Low Risk  (01/26/2024)   Overall Financial Resource Strain (CARDIA)    Difficulty of Paying Living Expenses: Not hard at all  Food Insecurity: No Food Insecurity (01/26/2024)   Hunger  Vital Sign    Worried About Running Out of Food in the Last Year: Never true    Ran Out of Food in the Last Year: Never true  Transportation Needs: No Transportation Needs (01/26/2024)   PRAPARE - Administrator, Civil Service (Medical): No    Lack of Transportation (Non-Medical): No  Physical Activity: Insufficiently Active (01/26/2024)   Exercise Vital Sign    Days of Exercise per Week: 5 days    Minutes of Exercise per Session: 10 min  Stress: No Stress Concern Present (01/26/2024)   [employer] of Occupational Health - Occupational Stress Questionnaire    Feeling of Stress: Only a little  Social Connections: Socially Integrated (01/26/2024)   Social Connection and Isolation Panel    Frequency of Communication with Friends and Family: More than three times a week    Frequency of Social Gatherings with Friends and Family: More than three times a week    Attends Religious Services: More than 4 times per year    Active Member of [employer] or Organizations: Yes    Attends Banker Meetings: 1 to 4 times per year    Marital Status: Married    Review of Systems Per HPI  Objective:  BP 128/67   Pulse 97   Temp 98.1 F (36.7 C)   Ht 6' 2 (1.88 m)   Wt 249 lb (112.9 kg)  SpO2 94%   BMI 31.97 kg/m      01/29/2024    8:32 AM 11/20/2023    8:00 AM 10/09/2023    9:46 AM  BP/Weight  Systolic BP 128 103 134  Diastolic BP 67 67 67  Wt. (Lbs) 249 244.05 247  BMI 31.97 kg/m2 31.33 kg/m2 31.71 kg/m2    Physical Exam Vitals and nursing note reviewed.  Constitutional:      General: He is not in acute distress.    Appearance: Normal appearance.  HENT:     Head: Normocephalic and atraumatic.  Cardiovascular:     Rate and Rhythm: Normal rate and regular rhythm.  Pulmonary:     Effort: Pulmonary effort is normal.     Breath sounds: Normal breath sounds. No wheezing, rhonchi or rales.  Neurological:     Mental Status: He is alert.  Psychiatric:         Mood and Affect: Mood normal.        Behavior: Behavior normal.     Lab Results  Component Value Date   WBC 8.9 01/01/2024   HGB 17.2 01/01/2024   HCT 50.8 01/01/2024   PLT 217 01/01/2024   GLUCOSE 73 01/01/2024   CHOL 146 07/16/2023   TRIG 128 07/16/2023   HDL 44 07/16/2023   LDLCALC 79 07/16/2023   ALT 13 01/01/2024   AST 20 01/01/2024   NA 142 01/01/2024   K 3.8 01/01/2024   CL 106 01/01/2024   CREATININE 1.37 (H) 01/01/2024   BUN 23 01/01/2024   CO2 23 01/01/2024     Assessment & Plan:  Primary hypertension Assessment & Plan: Stable.  Continue current medications.   Low testosterone -     Testosterone,Free and Total  Moderate obstructive sleep apnea-hypopnea syndrome Assessment & Plan: Doing well on CPAP.  Continue.   Benign prostatic hyperplasia with nocturia Assessment & Plan: Stable on Flomax .  New Rx sent in.  Orders: -     Tamsulosin  HCl; Take 2 capsules (0.8 mg total) by mouth daily.  Dispense: 180 capsule; Refill: 3  Stage 3a chronic kidney disease (HCC) Assessment & Plan: Stable.  Will continue monitor closely.     Follow-up:  6 months to 1 year (will schedule nurse visit for vaccine)  Jacqulyn Ahle DO [facility]

## 2024-01-29 NOTE — Assessment & Plan Note (Signed)
Stable.  Will continue monitor closely.

## 2024-01-29 NOTE — Assessment & Plan Note (Signed)
 Stable on Flomax .  New Rx sent in.

## 2024-01-29 NOTE — Patient Instructions (Signed)
 Get your vaccines either here or at the pharmacy.  Lab today.  Follow up in 6 months.

## 2024-01-29 NOTE — Assessment & Plan Note (Signed)
 Stable.  Continue current medications.

## 2024-01-31 LAB — TESTOSTERONE,FREE AND TOTAL
Testosterone, Free: 3.6 pg/mL — AB (ref 6.6–18.1)
Testosterone: 421 ng/dL (ref 264–916)

## 2024-02-04 ENCOUNTER — Ambulatory Visit (INDEPENDENT_AMBULATORY_CARE_PROVIDER_SITE_OTHER): Admitting: Family Medicine

## 2024-02-04 DIAGNOSIS — Z23 Encounter for immunization: Secondary | ICD-10-CM

## 2024-02-04 NOTE — Progress Notes (Signed)
 Flu shot given by clinical staff.  Jacqulyn Ahle DO [facility]

## 2024-04-16 ENCOUNTER — Telehealth: Payer: Self-pay | Admitting: *Deleted

## 2024-04-16 NOTE — Telephone Encounter (Signed)
 Scheduled Initial CPAP with Duwaine, NP on 05/04/24 at 11:30 am.

## 2024-04-16 NOTE — Telephone Encounter (Signed)
 LVM for patient to call our office back to set up initial f/u visit for cpap .

## 2024-04-30 ENCOUNTER — Other Ambulatory Visit: Payer: Self-pay

## 2024-04-30 ENCOUNTER — Telehealth: Payer: Self-pay

## 2024-04-30 DIAGNOSIS — N401 Enlarged prostate with lower urinary tract symptoms: Secondary | ICD-10-CM

## 2024-04-30 MED ORDER — TAMSULOSIN HCL 0.4 MG PO CAPS: 0.8000 mg | ORAL_CAPSULE | Freq: Every day | ORAL | 3 refills | Status: AC

## 2024-04-30 NOTE — Telephone Encounter (Signed)
 Prescription Request  04/30/2024  LOV: Visit date not found  What is the name of the medication or equipment? tamsulosin  (FLOMAX ) 0.4 MG CAPS capsule   Have you contacted your pharmacy to request a refill? Yes   Which pharmacy would you like this sent to?  [id number] - [place], [place] - [address] [facility] [address] [place] [address] Phone: [phone] Fax: [phone]    Patient notified that their request is being sent to the clinical staff for review and that they should receive a response within 2 business days.   Please advise at Mobile [phone] (mobile)

## 2024-04-30 NOTE — Progress Notes (Unsigned)
 Setup date was 11/21/2023

## 2024-05-01 ENCOUNTER — Encounter: Payer: Self-pay | Admitting: Adult Health

## 2024-05-01 ENCOUNTER — Ambulatory Visit: Admitting: Adult Health

## 2024-05-01 VITALS — BP 106/68 | HR 82 | Ht 74.0 in | Wt 252.6 lb

## 2024-05-01 DIAGNOSIS — G4733 Obstructive sleep apnea (adult) (pediatric): Secondary | ICD-10-CM

## 2024-05-01 NOTE — Patient Instructions (Signed)
 Continue using CPAP nightly and greater than 4 hours each night If your symptoms worsen or you develop new symptoms please let us  know.

## 2024-05-01 NOTE — Progress Notes (Signed)
 "   PATIENT: Marcus [name] DOB: Feb 05, 1951  REASON FOR VISIT: follow up HISTORY FROM: patient PRIMARY NEUROLOGIST: Dr. Chalice  Chief Complaint  Patient presents with   Follow-up    Pt in 3 alone Pt here for cpap f/u Pt states dry mouth ,runny nose . Pt states chokes on saliva during the night     HISTORY OF PRESENT ILLNESS: Today 05/01/24:  Marcus [name] is a 74 y.o. male with a history of obstructive sleep apnea on CPAP. Returns today for follow-up.  This is his initial CPAP visit.  His sleep study showed that he had moderate sleep apnea.  He states that he is trying to get used to using the machine.  He does tend to drool so that makes it hard to use.  He does wear a fullface mask.  Does note dry mouth.  His download is below       HISTORY   REVIEW OF SYSTEMS: Out of a complete 14 system review of symptoms, the patient complains only of the following symptoms, and all other reviewed systems are negative.  FSS ESS  ALLERGIES: Allergies[1]  HOME MEDICATIONS: Outpatient Medications Prior to Visit  Medication Sig Dispense Refill   hydrochlorothiazide  (HYDRODIURIL ) 25 MG tablet Take 1 tablet (25 mg total) by mouth daily. 90 tablet 3   losartan  (COZAAR ) 50 MG tablet Take 1 tablet (50 mg total) by mouth daily. 90 tablet 3   Multiple Vitamin (MULTIVITAMIN WITH MINERALS) TABS tablet Take 1 tablet by mouth daily.     pantoprazole  (PROTONIX ) 40 MG tablet Take 1 tablet (40 mg total) by mouth daily. 90 tablet 3   tamsulosin  (FLOMAX ) 0.4 MG CAPS capsule Take 2 capsules (0.8 mg total) by mouth daily. 180 capsule 3   No facility-administered medications prior to visit.    PAST MEDICAL HISTORY: Past Medical History:  Diagnosis Date   Hypertension     PAST SURGICAL HISTORY: Past Surgical History:  Procedure Laterality Date   CHOLECYSTECTOMY     COLONOSCOPY     COLONOSCOPY N/A 08/21/2017   Procedure: COLONOSCOPY;  Surgeon: Shaaron Lamar HERO, MD;  Location: [facility];   Service: Endoscopy;  Laterality: N/A;  10:30    FAMILY HISTORY: Family History  Problem Relation Age of Onset   Parkinson's disease Mother    Congestive Heart Failure Father     SOCIAL HISTORY: Social History   Socioeconomic History   Marital status: Married    Spouse name: Not on file   Number of children: Not on file   Years of education: Not on file   Highest education level: 12th grade  Occupational History   Not on file  Tobacco Use   Smoking status: Never   Smokeless tobacco: Never  Vaping Use   Vaping status: Never Used  Substance and Sexual Activity   Alcohol use: No   Drug use: No   Sexual activity: Not on file  Other Topics Concern   Not on file  Social History Narrative   Not on file   Social Drivers of Health   Tobacco Use: Low Risk (04/07/2024)   Received from [facility]   Patient History    Smoking Tobacco Use: Never    Smokeless Tobacco Use: Never    Passive Exposure: Not on file  Financial Resource Strain: Low Risk (01/26/2024)   Overall Financial Resource Strain (CARDIA)    Difficulty of Paying Living Expenses: Not hard at all  Food Insecurity: No Food Insecurity (01/26/2024)  Epic    Worried About Programme Researcher, Broadcasting/film/video in the Last Year: Never true    [employer] of Food in the Last Year: Never true  Transportation Needs: No Transportation Needs (01/26/2024)   Epic    Lack of Transportation (Medical): No    Lack of Transportation (Non-Medical): No  Physical Activity: Insufficiently Active (01/26/2024)   Exercise Vital Sign    Days of Exercise per Week: 5 days    Minutes of Exercise per Session: 10 min  Stress: No Stress Concern Present (01/26/2024)   [employer] of Occupational Health - Occupational Stress Questionnaire    Feeling of Stress: Only a little  Social Connections: Socially Integrated (01/26/2024)   Social Connection and Isolation Panel    Frequency of Communication with Friends and Family: More than three times a week     Frequency of Social Gatherings with Friends and Family: More than three times a week    Attends Religious Services: More than 4 times per year    Active Member of [employer] or Organizations: Yes    Attends Banker Meetings: 1 to 4 times per year    Marital Status: Married  Catering Manager Violence: Not At Risk (05/24/2023)   Humiliation, Afraid, Rape, and Kick questionnaire    Fear of Current or Ex-Partner: No    Emotionally Abused: No    Physically Abused: No    Sexually Abused: No  Depression (PHQ2-9): Low Risk (11/20/2023)   Depression (PHQ2-9)    PHQ-2 Score: 0  Alcohol Screen: Low Risk (05/21/2023)   Alcohol Screen    Last Alcohol Screening Score (AUDIT): 0  Housing: Unknown (01/26/2024)   Epic    Unable to Pay for Housing in the Last Year: No    Number of Times Moved in the Last Year: Not on file    Homeless in the Last Year: No  Utilities: Not At Risk (05/24/2023)   AHC Utilities    Threatened with loss of utilities: No  Health Literacy: Adequate Health Literacy (05/24/2023)   B1300 Health Literacy    Frequency of need for help with medical instructions: Never      PHYSICAL EXAM  Vitals:   05/01/24 1009  BP: 106/68  Pulse: 82  Weight: 252 lb 9.6 oz (114.6 kg)  Height: 6' 2 (1.88 m)   Body mass index is 32.43 kg/m.  Generalized: Well developed, in no acute distress  Chest: Lungs clear to auscultation bilaterally  Neurological examination  Mentation: Alert oriented to time, place, history taking. Follows all commands speech and language fluent Cranial nerve II-XII: Facial symmetry noted  DIAGNOSTIC DATA (LABS, IMAGING, TESTING) - I reviewed patient records, labs, notes, testing and imaging myself where available.  Lab Results  Component Value Date   WBC 8.9 01/01/2024   HGB 17.2 01/01/2024   HCT 50.8 01/01/2024   MCV 92 01/01/2024   PLT 217 01/01/2024      Component Value Date/Time   NA 142 01/01/2024 1422   K 3.8 01/01/2024 1422   CL 106  01/01/2024 1422   CO2 23 01/01/2024 1422   GLUCOSE 73 01/01/2024 1422   GLUCOSE 93 11/13/2023 0817   BUN 23 01/01/2024 1422   CREATININE 1.37 (H) 01/01/2024 1422   CALCIUM 10.0 01/01/2024 1422   PROT 5.9 (L) 01/01/2024 1422   ALBUMIN 3.8 01/01/2024 1422   AST 20 01/01/2024 1422   ALT 13 01/01/2024 1422   ALKPHOS 101 01/01/2024 1422   BILITOT 0.7 01/01/2024 1422  GFRNONAA >60 11/13/2023 0817   Lab Results  Component Value Date   CHOL 146 07/16/2023   HDL 44 07/16/2023   LDLCALC 79 07/16/2023   TRIG 128 07/16/2023   CHOLHDL 3.3 07/16/2023      ASSESSMENT AND PLAN 74 y.o. year old male  has a past medical history of Hypertension. here with:  OSA on CPAP  - CPAP compliance excellent - Good treatment of AHI  - Encourage patient to use CPAP nightly and > 4 hours each night -I checked the patient's machine in office today.  His humidification was turned off.  I did show him how to turn it on and we set it at a level of 4.  He will see if this is beneficial for dry mouth.  Not he will let us  know. - F/U in 6 months or sooner if needed  Duwaine Russell, MSN, NP-C 05/01/2024, 5:47 AM [facility] [address], [address] [place], [place] [address] [phone]      [1] No Known Allergies  "

## 2024-05-04 ENCOUNTER — Ambulatory Visit: Admitting: Adult Health

## 2024-05-13 ENCOUNTER — Inpatient Hospital Stay

## 2024-05-13 DIAGNOSIS — D751 Secondary polycythemia: Secondary | ICD-10-CM

## 2024-05-13 LAB — CBC
HCT: 50.3 % (ref 39.0–52.0)
Hemoglobin: 17.3 g/dL — ABNORMAL HIGH (ref 13.0–17.0)
MCH: 30.2 pg (ref 26.0–34.0)
MCHC: 34.4 g/dL (ref 30.0–36.0)
MCV: 87.8 fL (ref 80.0–100.0)
Platelets: 207 10*3/uL (ref 150–400)
RBC: 5.73 MIL/uL (ref 4.22–5.81)
RDW: 14.2 % (ref 11.5–15.5)
WBC: 9.7 10*3/uL (ref 4.0–10.5)
nRBC: 0 % (ref 0.0–0.2)

## 2024-05-20 ENCOUNTER — Inpatient Hospital Stay: Admitting: Physician Assistant

## 2024-05-29 ENCOUNTER — Ambulatory Visit: Payer: Medicare Other

## 2025-01-29 ENCOUNTER — Ambulatory Visit: Admitting: Family Medicine

## 2025-02-01 ENCOUNTER — Encounter: Admitting: Adult Health
